# Patient Record
Sex: Female | Born: 1960 | Race: White | Hispanic: No | Marital: Married | Smoking: Never smoker
Health system: Southern US, Community
[De-identification: ages and names within clinical notes are randomized; demographics above are authoritative.]

## PROBLEM LIST (undated history)

## (undated) DIAGNOSIS — G43909 Migraine, unspecified, not intractable, without status migrainosus: Secondary | ICD-10-CM

## (undated) DIAGNOSIS — G4761 Periodic limb movement disorder: Secondary | ICD-10-CM

## (undated) DIAGNOSIS — S52509A Unspecified fracture of the lower end of unspecified radius, initial encounter for closed fracture: Secondary | ICD-10-CM

## (undated) DIAGNOSIS — R0683 Snoring: Secondary | ICD-10-CM

## (undated) DIAGNOSIS — M5030 Other cervical disc degeneration, unspecified cervical region: Secondary | ICD-10-CM

## (undated) HISTORY — PX: WISDOM TOOTH EXTRACTION: SHX21

## (undated) HISTORY — PX: OTHER SURGICAL HISTORY: SHX169

## (undated) HISTORY — DX: Migraine, unspecified, not intractable, without status migrainosus: G43.909

---

## 1998-04-04 ENCOUNTER — Other Ambulatory Visit: Admission: RE | Admit: 1998-04-04 | Discharge: 1998-04-04 | Payer: Self-pay | Admitting: Obstetrics and Gynecology

## 1999-07-03 ENCOUNTER — Other Ambulatory Visit: Admission: RE | Admit: 1999-07-03 | Discharge: 1999-07-03 | Payer: Self-pay | Admitting: Obstetrics and Gynecology

## 2000-02-21 ENCOUNTER — Inpatient Hospital Stay (HOSPITAL_COMMUNITY): Admission: AD | Admit: 2000-02-21 | Discharge: 2000-02-22 | Payer: Self-pay | Admitting: Obstetrics & Gynecology

## 2000-11-15 ENCOUNTER — Encounter: Admission: RE | Admit: 2000-11-15 | Discharge: 2000-11-15 | Payer: Self-pay | Admitting: Family Medicine

## 2000-11-15 ENCOUNTER — Encounter: Payer: Self-pay | Admitting: Family Medicine

## 2001-08-18 ENCOUNTER — Other Ambulatory Visit: Admission: RE | Admit: 2001-08-18 | Discharge: 2001-08-18 | Payer: Self-pay | Admitting: Obstetrics and Gynecology

## 2002-07-31 ENCOUNTER — Other Ambulatory Visit: Admission: RE | Admit: 2002-07-31 | Discharge: 2002-07-31 | Payer: Self-pay | Admitting: Obstetrics and Gynecology

## 2002-11-06 ENCOUNTER — Other Ambulatory Visit: Admission: RE | Admit: 2002-11-06 | Discharge: 2002-11-06 | Payer: Self-pay | Admitting: Obstetrics and Gynecology

## 2003-01-30 ENCOUNTER — Other Ambulatory Visit: Admission: RE | Admit: 2003-01-30 | Discharge: 2003-01-30 | Payer: Self-pay | Admitting: Obstetrics and Gynecology

## 2004-03-03 ENCOUNTER — Other Ambulatory Visit: Admission: RE | Admit: 2004-03-03 | Discharge: 2004-03-03 | Payer: Self-pay | Admitting: Obstetrics and Gynecology

## 2005-11-24 ENCOUNTER — Encounter: Admission: RE | Admit: 2005-11-24 | Discharge: 2005-11-24 | Payer: Self-pay | Admitting: Sports Medicine

## 2006-11-23 ENCOUNTER — Encounter: Admission: RE | Admit: 2006-11-23 | Discharge: 2006-11-23 | Payer: Self-pay | Admitting: Orthopedic Surgery

## 2007-05-14 ENCOUNTER — Emergency Department (HOSPITAL_COMMUNITY): Admission: EM | Admit: 2007-05-14 | Discharge: 2007-05-15 | Payer: Self-pay | Admitting: Emergency Medicine

## 2011-07-27 ENCOUNTER — Other Ambulatory Visit: Payer: Self-pay | Admitting: Internal Medicine

## 2011-07-27 DIAGNOSIS — E010 Iodine-deficiency related diffuse (endemic) goiter: Secondary | ICD-10-CM

## 2011-07-28 ENCOUNTER — Ambulatory Visit
Admission: RE | Admit: 2011-07-28 | Discharge: 2011-07-28 | Disposition: A | Discharge status: Home / Self Care | Payer: BC Managed Care – PPO | Source: Ambulatory Visit | Attending: Internal Medicine | Admitting: Internal Medicine

## 2011-07-28 DIAGNOSIS — E010 Iodine-deficiency related diffuse (endemic) goiter: Secondary | ICD-10-CM

## 2012-02-16 ENCOUNTER — Other Ambulatory Visit: Payer: Self-pay | Admitting: Internal Medicine

## 2012-02-16 DIAGNOSIS — M25551 Pain in right hip: Secondary | ICD-10-CM

## 2012-02-24 ENCOUNTER — Ambulatory Visit
Admission: RE | Admit: 2012-02-24 | Discharge: 2012-02-24 | Disposition: A | Discharge status: Home / Self Care | Payer: BC Managed Care – PPO | Source: Ambulatory Visit | Attending: Internal Medicine | Admitting: Internal Medicine

## 2012-02-24 DIAGNOSIS — M25551 Pain in right hip: Secondary | ICD-10-CM

## 2012-07-20 ENCOUNTER — Other Ambulatory Visit (INDEPENDENT_AMBULATORY_CARE_PROVIDER_SITE_OTHER): Payer: Self-pay | Admitting: Otolaryngology

## 2012-07-20 DIAGNOSIS — J320 Chronic maxillary sinusitis: Secondary | ICD-10-CM

## 2012-08-04 ENCOUNTER — Ambulatory Visit
Admission: RE | Admit: 2012-08-04 | Discharge: 2012-08-04 | Disposition: A | Discharge status: Home / Self Care | Payer: BC Managed Care – PPO | Source: Ambulatory Visit | Attending: Otolaryngology | Admitting: Otolaryngology

## 2012-08-04 DIAGNOSIS — J320 Chronic maxillary sinusitis: Secondary | ICD-10-CM

## 2013-11-08 ENCOUNTER — Other Ambulatory Visit (INDEPENDENT_AMBULATORY_CARE_PROVIDER_SITE_OTHER): Payer: Self-pay | Admitting: General Surgery

## 2013-11-08 DIAGNOSIS — C50419 Malignant neoplasm of upper-outer quadrant of unspecified female breast: Secondary | ICD-10-CM

## 2013-11-08 DIAGNOSIS — C50411 Malignant neoplasm of upper-outer quadrant of right female breast: Secondary | ICD-10-CM

## 2013-11-14 ENCOUNTER — Other Ambulatory Visit (INDEPENDENT_AMBULATORY_CARE_PROVIDER_SITE_OTHER): Payer: Self-pay | Admitting: General Surgery

## 2013-11-24 ENCOUNTER — Encounter: Payer: Self-pay | Admitting: Neurology

## 2013-11-24 ENCOUNTER — Ambulatory Visit (INDEPENDENT_AMBULATORY_CARE_PROVIDER_SITE_OTHER): Payer: BC Managed Care – PPO | Admitting: Neurology

## 2013-11-24 VITALS — BP 129/82 | HR 91 | Ht 65.0 in | Wt 142.0 lb

## 2013-11-24 DIAGNOSIS — G4733 Obstructive sleep apnea (adult) (pediatric): Secondary | ICD-10-CM

## 2013-11-24 NOTE — Progress Notes (Signed)
Subjective:    Patient ID: Lorraine Owen is a 53 y.o. female.  HPI    Star Age, MD, PhD [facility] [address] [place], [place] [address]  Dear Dr. Osborne Casco,   I saw your patient, Lorraine Owen, upon your kind request in my neurologic clinic today for initial consultation of her sleep disturbance, in particular, concern for underlying obstructive sleep apnea. The patient is unaccompanied today. As you know, Ms. Pricilla Owen is a 53 year old right-handed woman with an underlying medical history of B12 deficiency, migraine headaches (has started seeing Dr. Reuel Boom), HSV-2, who reports nocturia x 2-3 times daily, snoring, morning headaches and daytime somnolence. She also endorses some RLS symptoms and has been known to kick in her sleep. She has been told, that she has gasping sounds. She has always been someone to sleep very little about 4 hours up until she turned 43 and consciously tried to increase her total sleep time.   Her typical bedtime is reported to be around 10 PM and usual wake time is around 6 AM. Sleep onset typically occurs within few minutes. She reports feeling adequately rested upon awakening. She wakes up at 2 AM and has trouble staying asleep till 6 AM. She has morning headaches. Her ESS is 1/24. There is no TV in her bedroom. She drinks 3 glasses of tea in the day.    She has been known to snore for the past few years. Snoring is reportedly moderate, and associated with choking sounds and witnessed apneas. The patient admits to a sense of choking or strangling feeling. There is report of nighttime reflux, with occasional nighttime cough experienced. She has seen ENT in the past. She was treated for nasal congestion with a nasal spray.   Her Past Medical History Is Significant For: Past Medical History  Diagnosis Date  . Migraine     Her Past Surgical History Is Significant For: Past Surgical History  Procedure  Laterality Date  . None      Her Family History Is Significant For: Family History  Problem Relation Age of Onset  . Migraines Mother     Her Social History Is Significant For: History   Social History  . Marital Status: Married    Spouse Name: Dellis Filbert    Number of Children: 2  . Years of Education: college   Occupational History  . CPA    Social History Main Topics  . Smoking status: Never Smoker   . Smokeless tobacco: Not on file  . Alcohol Use: No  . Drug Use: No  . Sexual Activity: Not on file   Other Topics Concern  . Not on file   Social History Narrative  . No narrative on file    Her Allergies Are:  Allergies  Allergen Reactions  . Penicillins   . Sulfa Antibiotics   :   Her Current Medications Are:  Outpatient Encounter Prescriptions as of 11/24/2013  Medication Sig  . flurbiprofen (ANSAID) 100 MG tablet   . imipramine (TOFRANIL) 10 MG tablet   . levonorgestrel-ethinyl estradiol (NORDETTE) 0.15-30 MG-MCG tablet   . SUMAtriptan (IMITREX) 50 MG tablet   :  Review of Systems:  Out of a complete 14 point review of systems, all are reviewed and negative with the exception of these symptoms as listed below:  Review of Systems  Allergic/Immunologic:       Some allergies  Neurological:       Headaches Insomnia Snoring Restless legs  Objective:  Neurologic Exam  Physical Exam Physical Examination:   Filed Vitals:   11/24/13 1021  BP: 129/82  Pulse: 91    General Examination: The patient is a very pleasant 53 y.o. female in no acute distress. She appears well-developed and well-nourished and very well groomed.   HEENT: Normocephalic, atraumatic, pupils are equal, round and reactive to light and accommodation. Funduscopic exam is normal with sharp disc margins noted. Extraocular tracking is good without limitation to gaze excursion or nystagmus noted. Normal smooth pursuit is noted. Hearing is grossly intact. Tympanic membranes are clear  bilaterally. Face is symmetric with normal facial animation and normal facial sensation. Speech is clear with no dysarthria noted. There is no hypophonia. There is no lip, neck/head, jaw or voice tremor. Neck is supple with full range of passive and active motion. There are no carotid bruits on auscultation. Oropharynx exam reveals: mild mouth dryness, good dental hygiene and mild airway crowding, due to  narrow airway entry and redundant soft palate. Tongue is mildly elongated but not wide. Mallampati is class II. Tongue protrudes centrally and palate elevates symmetrically. Tonsils are small. Neck size is 13 inches. She has a Mild overbite. Nasal inspection reveals no significant nasal mucosal bogginess  but she has narrow nasal passages. She has mild septal deviation to the right.    Chest: Clear to auscultation without wheezing, rhonchi or crackles noted.  Heart: S1+S2+0, regular and normal without murmurs, rubs or gallops noted.   Abdomen: Soft, non-tender and non-distended with normal bowel sounds appreciated on auscultation.  Extremities: There is no pitting edema in the distal lower extremities bilaterally. Pedal pulses are intact.  Skin: Warm and dry without trophic changes noted. There are no varicose veins.  Musculoskeletal: exam reveals no obvious joint deformities, tenderness or joint swelling or erythema.   Neurologically:  Mental status: The patient is awake, alert and oriented in all 4 spheres. Her immediate and remote memory, attention, language skills and fund of knowledge are appropriate. There is no evidence of aphasia, agnosia, apraxia or anomia. Speech is clear with normal prosody and enunciation. Thought process is linear. Mood is normal and affect is normal.  Cranial nerves II - XII are as described above under HEENT exam. In addition: shoulder shrug is normal with equal shoulder height noted. Motor exam: Normal bulk, strength and tone is noted. There is no drift, tremor or  rebound. Romberg is negative. Reflexes are 2+ throughout. Babinski: Toes are flexor bilaterally. Fine motor skills and coordination: intact with normal finger taps, normal hand movements, normal rapid alternating patting, normal foot taps and normal foot agility.  Cerebellar testing: No dysmetria or intention tremor on finger to nose testing. Heel to shin is unremarkable bilaterally. There is no truncal or gait ataxia.  Sensory exam: intact to light touch, pinprick, vibration, temperature sense in the upper and lower extremities.  Gait, station and balance: She stands easily. No veering to one side is noted. No leaning to one side is noted. Posture is age-appropriate and stance is narrow based. Gait shows normal stride length and normal pace. No problems turning are noted. She turns en bloc. Tandem walk is unremarkable. Intact toe and heel stance is noted.               Assessment and Plan:  In summary, SERAPHINE GUDIEL is a very pleasant 53 y.o.-year old female with an underlying medical history of B12 deficiency, migraine headaches (has started seeing Dr. Reuel Boom), HSV-2, whose history and physical  exam are indeed concerning for obstructive sleep apnea (OSA). She reports nonrestorative sleep, sleep disruption, nocturia, morning headaches, snoring and witnessed apneas per husband. Of note, her sister is in the process of being evaluated for obstructive sleep apnea. She has a total of 6 siblings. Of note, her father was a very heavy snorer. I had a long chat with the patient about my findings and the diagnosis of OSA, its prognosis and treatment options. We talked about medical treatments, surgical interventions and non-pharmacological approaches. I explained in particular the risks and ramifications of untreated moderate to severe OSA, especially with respect to developing cardiovascular disease down the Road, including congestive heart failure, difficult to treat hypertension, cardiac arrhythmias, or  stroke. Even type 2 diabetes has, in part, been linked to untreated OSA. Symptoms of untreated OSA include daytime sleepiness, memory problems, mood irritability and mood disorder such as depression and anxiety, lack of energy, as well as recurrent headaches, especially morning headaches. We talked about trying to maintain a healthy lifestyle in general, as well as the importance of weight control. I encouraged the patient to eat healthy, exercise daily and keep well hydrated, to keep a scheduled bedtime and wake time routine, to not skip any meals and eat healthy snacks in between meals. I advised the patient not to drive when feeling sleepy. I recommended the following at this time: sleep study with potential positive airway pressure titration. (We will score hypopneas at 3% and split the sleep study into diagnostic and treatment portion, if the estimated. 2 hour AHI is >15/h).   I explained the sleep test procedure to the patient and also outlined possible surgical and non-surgical treatment options of OSA, including the use of a custom-made dental device (which would require a referral to a specialist dentist or oral surgeon), upper airway surgical options, such as pillar implants, radiofrequency surgery, tongue base surgery, and UPPP (which would involve a referral to an ENT surgeon). Rarely, jaw surgery such as mandibular advancement may be considered.  I also explained the CPAP treatment option to the patient, who indicated that she would be willing to try CPAP if the need arises. I explained the importance of being compliant with PAP treatment, not only for insurance purposes but primarily to improve Her symptoms, and for the patient's long term health benefit, including to reduce Her cardiovascular risks. I answered all her questions today and the patient was in agreement. I would like to see her back after the sleep study is completed and encouraged her to call with any interim questions, concerns,  problems or updates.   Thank you very much for allowing me to participate in the care of this nice patient. If I can be of any further assistance to you please do not hesitate to call me at [phone].  Sincerely,   Star Age, MD, PhD

## 2014-01-21 ENCOUNTER — Ambulatory Visit (INDEPENDENT_AMBULATORY_CARE_PROVIDER_SITE_OTHER): Payer: BC Managed Care – PPO | Admitting: Neurology

## 2014-01-21 DIAGNOSIS — G4720 Circadian rhythm sleep disorder, unspecified type: Secondary | ICD-10-CM

## 2014-01-21 DIAGNOSIS — G479 Sleep disorder, unspecified: Secondary | ICD-10-CM

## 2014-01-21 DIAGNOSIS — G4733 Obstructive sleep apnea (adult) (pediatric): Secondary | ICD-10-CM

## 2014-01-21 DIAGNOSIS — R0683 Snoring: Secondary | ICD-10-CM

## 2014-01-21 DIAGNOSIS — G4761 Periodic limb movement disorder: Secondary | ICD-10-CM

## 2014-01-21 NOTE — Sleep Study (Signed)
Please see the scanned sleep study interpretation located in the Procedure tab within the Chart Review section. 

## 2014-01-31 ENCOUNTER — Telehealth: Payer: Self-pay | Admitting: Neurology

## 2014-01-31 ENCOUNTER — Encounter: Payer: Self-pay | Admitting: Neurology

## 2014-01-31 NOTE — Telephone Encounter (Signed)
Please call and notify the patient that the recent sleep study did not show any significant obstructive sleep apnea. But: She has significant PLMs, ie. Leg kicking in sleep with arousals, likely causing poor quality sleep and disruption of sleep. Please inform patient that I would like to go over the details of the study during a follow up appointment and if not already previously scheduled, arrange a followup appointment (please utilize a followu-up slot). Also, route or fax report to PCP and referring MD, if other than PCP.  Once you have spoken to patient, you can close this encounter.   Thanks,  Star Age, MD, PhD [facility])

## 2014-02-01 NOTE — Telephone Encounter (Signed)
Patient notified of sleep study results and follow up appointment scheduled. Patient was made aware that her sleep study results were faxed to her PCP, and gave verbal to mail her results.

## 2014-02-20 ENCOUNTER — Ambulatory Visit: Payer: BC Managed Care – PPO | Admitting: Neurology

## 2014-02-20 ENCOUNTER — Telehealth: Payer: Self-pay | Admitting: Neurology

## 2014-02-20 NOTE — Telephone Encounter (Signed)
Patient is a no show for today's appointment, daughter is sick (02/20/14)

## 2014-02-20 NOTE — Telephone Encounter (Signed)
Child is sick, per patient ,cancelled appointment(02/20/14)

## 2014-04-15 ENCOUNTER — Emergency Department (HOSPITAL_COMMUNITY): Payer: BLUE CROSS/BLUE SHIELD

## 2014-04-15 ENCOUNTER — Encounter (HOSPITAL_COMMUNITY): Payer: Self-pay | Admitting: Emergency Medicine

## 2014-04-15 ENCOUNTER — Emergency Department (HOSPITAL_COMMUNITY)
Admission: EM | Admit: 2014-04-15 | Discharge: 2014-04-15 | Disposition: A | Discharge status: Home / Self Care | Payer: BLUE CROSS/BLUE SHIELD | Attending: Emergency Medicine | Admitting: Emergency Medicine

## 2014-04-15 DIAGNOSIS — G43909 Migraine, unspecified, not intractable, without status migrainosus: Secondary | ICD-10-CM | POA: Insufficient documentation

## 2014-04-15 DIAGNOSIS — H538 Other visual disturbances: Secondary | ICD-10-CM | POA: Diagnosis present

## 2014-04-15 DIAGNOSIS — Z880 Allergy status to penicillin: Secondary | ICD-10-CM | POA: Diagnosis not present

## 2014-04-15 DIAGNOSIS — H539 Unspecified visual disturbance: Secondary | ICD-10-CM

## 2014-04-15 DIAGNOSIS — R202 Paresthesia of skin: Secondary | ICD-10-CM | POA: Diagnosis not present

## 2014-04-15 DIAGNOSIS — G43109 Migraine with aura, not intractable, without status migrainosus: Secondary | ICD-10-CM

## 2014-04-15 HISTORY — DX: Periodic limb movement disorder: G47.61

## 2014-04-15 HISTORY — DX: Snoring: R06.83

## 2014-04-15 HISTORY — DX: Other cervical disc degeneration, unspecified cervical region: M50.30

## 2014-04-15 LAB — COMPREHENSIVE METABOLIC PANEL
ALBUMIN: 3.8 g/dL (ref 3.5–5.2)
ALT: 13 U/L (ref 0–35)
ANION GAP: 4 — AB (ref 5–15)
AST: 18 U/L (ref 0–37)
Alkaline Phosphatase: 47 U/L (ref 39–117)
BUN: 12 mg/dL (ref 6–23)
CO2: 27 mmol/L (ref 19–32)
Calcium: 8.8 mg/dL (ref 8.4–10.5)
Chloride: 108 mmol/L (ref 96–112)
Creatinine, Ser: 1.03 mg/dL (ref 0.50–1.10)
GFR calc Af Amer: 71 mL/min — ABNORMAL LOW (ref >90)
GFR, EST NON AFRICAN AMERICAN: 61 mL/min — AB (ref >90)
GLUCOSE: 109 mg/dL — AB (ref 70–99)
Potassium: 3.5 mmol/L (ref 3.5–5.1)
Sodium: 139 mmol/L (ref 135–145)
Total Bilirubin: 0.1 mg/dL — ABNORMAL LOW (ref 0.3–1.2)
Total Protein: 6.3 g/dL (ref 6.0–8.3)

## 2014-04-15 LAB — DIFFERENTIAL
BASOS ABS: 0 10*3/uL (ref 0.0–0.1)
Basophils Relative: 0 % (ref 0–1)
EOS PCT: 1 % (ref 0–5)
Eosinophils Absolute: 0.1 10*3/uL (ref 0.0–0.7)
Lymphocytes Relative: 33 % (ref 12–46)
Lymphs Abs: 2.1 10*3/uL (ref 0.7–4.0)
MONO ABS: 0.3 10*3/uL (ref 0.1–1.0)
Monocytes Relative: 5 % (ref 3–12)
Neutro Abs: 3.9 10*3/uL (ref 1.7–7.7)
Neutrophils Relative %: 61 % (ref 43–77)

## 2014-04-15 LAB — I-STAT CHEM 8, ED
BUN: 13 mg/dL (ref 6–23)
Calcium, Ion: 1.19 mmol/L (ref 1.12–1.23)
Chloride: 104 mmol/L (ref 96–112)
Creatinine, Ser: 1 mg/dL (ref 0.50–1.10)
Glucose, Bld: 103 mg/dL — ABNORMAL HIGH (ref 70–99)
HCT: 39 % (ref 36.0–46.0)
Hemoglobin: 13.3 g/dL (ref 12.0–15.0)
POTASSIUM: 3.5 mmol/L (ref 3.5–5.1)
SODIUM: 141 mmol/L (ref 135–145)
TCO2: 22 mmol/L (ref 0–100)

## 2014-04-15 LAB — RAPID URINE DRUG SCREEN, HOSP PERFORMED
AMPHETAMINES: NOT DETECTED
Barbiturates: NOT DETECTED
Benzodiazepines: NOT DETECTED
Cocaine: NOT DETECTED
OPIATES: NOT DETECTED
TETRAHYDROCANNABINOL: NOT DETECTED

## 2014-04-15 LAB — URINALYSIS, ROUTINE W REFLEX MICROSCOPIC
BILIRUBIN URINE: NEGATIVE
Glucose, UA: NEGATIVE mg/dL
HGB URINE DIPSTICK: NEGATIVE
Ketones, ur: NEGATIVE mg/dL
Leukocytes, UA: NEGATIVE
Nitrite: NEGATIVE
Protein, ur: NEGATIVE mg/dL
SPECIFIC GRAVITY, URINE: 1.007 (ref 1.005–1.030)
Urobilinogen, UA: 0.2 mg/dL (ref 0.0–1.0)
pH: 6 (ref 5.0–8.0)

## 2014-04-15 LAB — CBC
HCT: 37.1 % (ref 36.0–46.0)
Hemoglobin: 12.7 g/dL (ref 12.0–15.0)
MCH: 30.7 pg (ref 26.0–34.0)
MCHC: 34.2 g/dL (ref 30.0–36.0)
MCV: 89.6 fL (ref 78.0–100.0)
PLATELETS: 319 10*3/uL (ref 150–400)
RBC: 4.14 MIL/uL (ref 3.87–5.11)
RDW: 12.5 % (ref 11.5–15.5)
WBC: 6.4 10*3/uL (ref 4.0–10.5)

## 2014-04-15 LAB — PROTIME-INR
INR: 0.97 (ref 0.00–1.49)
Prothrombin Time: 13 seconds (ref 11.6–15.2)

## 2014-04-15 LAB — APTT: aPTT: 28 seconds (ref 24–37)

## 2014-04-15 LAB — ETHANOL: Alcohol, Ethyl (B): <5 mg/dL (ref 0–9)

## 2014-04-15 LAB — I-STAT TROPONIN, ED: Troponin i, poc: 0 ng/mL (ref 0.00–0.08)

## 2014-04-15 MED ORDER — ONDANSETRON HCL 4 MG PO TABS: 4.0000 mg | ORAL_TABLET | Freq: Four times a day (QID) | ORAL | Status: DC | PRN

## 2014-04-15 NOTE — ED Notes (Signed)
Pt c/o vision changes to both eyes at 1320 followed by numbness to right hand. Pt reports vision cleared up but numbness remains. Speech clear, no facial droop, equal grips.

## 2014-04-15 NOTE — ED Provider Notes (Signed)
6:25 PM Ct Head (brain) Wo Contrast  04/15/2014   CLINICAL DATA:  Could stroke, blurry vision  EXAM: CT HEAD WITHOUT CONTRAST  TECHNIQUE: Contiguous axial images were obtained from the base of the skull through the vertex without intravenous contrast.  COMPARISON:  None.  FINDINGS: No acute intracranial hemorrhage. No focal mass lesion. No CT evidence of acute infarction. No midline shift or mass effect. No hydrocephalus. Basilar cisterns are patent.  Paranasal sinuses and  mastoid air cells are clear.  IMPRESSION: Normal head CT.  Findings conveyed to [facility] 04/15/2014  at14:55.   Electronically Signed   By: Suzy Bouchard M.D.   On: 04/15/2014 14:56   Mr Brain Wo Contrast  04/15/2014   CLINICAL DATA:  Sudden onset of visual change that began earlier today. This was followed by RIGHT upper extremity numbness and tingling. History of migraine headaches. Initial encounter.  EXAM: MRI HEAD WITHOUT CONTRAST  TECHNIQUE: Multiplanar, multiecho pulse sequences of the brain and surrounding structures were obtained without intravenous contrast.  COMPARISON:  CT head earlier today.  FINDINGS: Mild motion degradation.  Overall study diagnostic.  No evidence for acute infarction, hemorrhage, mass lesion, hydrocephalus, or extra-axial fluid. Normal cerebral volume. No significant white matter disease. Flow voids are maintained throughout the carotid, basilar, and vertebral arteries. There are no areas of chronic hemorrhage. Pituitary, pineal, and cerebellar tonsils unremarkable. No upper cervical lesions. Visualized calvarium, skull base, and upper cervical osseous structures unremarkable. Scalp and extracranial soft tissues, orbits, sinuses, and mastoids show no acute process. 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  IMPRESSION: Negative exam.   Electronically Signed   By: Rolla Flatten M.D.   On: 04/15/2014 17:55  I personally reviewed the imaging tests through PACS  system I reviewed available ER/hospitalization records through the EMR   6:25 PM Patient feels much better this time.  She is asymptomatic.  She did have some nausea earlier and therefore she'll be prescribed Zofran for home.  She denies pain at this time.  She denies weakness of her arms or legs.  No visual disturbances at this time.  Please see neurology consultation note for complete details.  MRI negative.  Possible migraine variant.  Hoy Morn, MD 04/15/14 [phone]

## 2014-04-15 NOTE — Consult Note (Signed)
Referring Physician: Dr Thurnell Garbe    Chief Complaint: Visual disturbance with right upper extremity numbness  HPI: Lorraine Owen is a 54 y.o. female patient of T[facility]'s who also recently started seeing Dr. Melton Alar for a history of migraine headaches. The patient has otherwise been healthy. She has never had any associated symptoms with her migraine headaches in the past. They are usually relieved with Imitrex. Occasionally she takes prescription strength ibuprofen as well as imipramine at bedtime. Today she was [place] the [place] texting her daughter on her phone when she developed visual disturbances consisting of a very bright light in both visual fields. She had difficulty seeing her phone but had no problems with spelling or word finding. She lives close by and was by herself so she decided to drive home which she was able to do without difficulty. Upon arriving home she had another daughter look up stroke symptoms on the computer. Shortly after arriving home she developed numbness and tingling, but no weakness, of the right upper extremity. She estimated the time of her initial visual deficits at approximately 1:20 PM. The numbness and tingling started about 10 minutes later. She did not have a headache associated with these symptoms She came to the [facility] emergency department as a code stroke. An initial head CT was negative. Her NIH scale was 0. The visual disturbance had resolved but she continued to have tingling of her right hand.   Her husband accompanied her to the emergency department. 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 She is scheduled to fly to [facility] at 7:30 AM tomorrow on business. She does not wish to be admitted to the hospital. Dr. Doy Mince has ordered an MRI of the brain. If this is negative the patient will be discharged from the emergency department with further workup as needed on an  outpatient basis.    Date last known well: Date: 04/15/2014 Time last known well: Time: 13:15 tPA Given: No: Minimal deficits, symptoms resolving.  Past Medical History  Diagnosis Date  . Migraine   . DDD (degenerative disc disease), cervical   . Periodic limb movement disorder (PLMD)   . Snoring     Past Surgical History  Procedure Laterality Date  . None      Family History  Problem Relation Age of Onset  . Migraines Mother   Her father died in his 32s from natural causes. Her mother is almost 53 and is alive and well with a history of migraine headaches.   Social History:  reports that she has never smoked. She does not have any smokeless tobacco history on file. She reports that she does not drink alcohol or use illicit drugs. she works as a Engineer, maintenance (IT). She lives in [place] with her husband and between them they have 4 children.  Allergies:  Allergies  Allergen Reactions  . Penicillins   . Sulfa Antibiotics     Medications: No current facility-administered medications for this encounter.   Current Outpatient Prescriptions  Medication Sig Dispense Refill  . flurbiprofen (ANSAID) 100 MG tablet Take 100 mg by mouth daily as needed (headache).     . fluticasone (FLONASE) 50 MCG/ACT nasal spray Place 1 spray into both nostrils daily as needed for allergies or rhinitis.    Marland Kitchen imipramine (TOFRANIL) 10 MG tablet Take 20 mg by mouth at bedtime.     Marland Kitchen levonorgestrel-ethinyl estradiol (NORDETTE)  0.15-30 MG-MCG tablet Take 1 tablet by mouth daily.     . Probiotic Product (PROBIOTIC PO) Take 1 tablet by mouth daily.    . SUMAtriptan (IMITREX) 50 MG tablet Take 50 mg by mouth daily as needed for migraine or headache.     . vitamin B-12 (CYANOCOBALAMIN) 1000 MCG tablet Take 1,000 mcg by mouth daily.       ROS: History obtained from the patient and husband  General ROS: negative for - chills, fatigue, fever, night sweats, weight gain or weight loss. Positive for severe stress  recently related to work. Psychological ROS: negative for - behavioral disorder, hallucinations, memory difficulties, mood swings or suicidal ideation Ophthalmic ROS: negative for - blurry vision, double vision, eye pain or loss of vision ENT ROS: negative for - epistaxis, nasal discharge, oral lesions, sore throat, tinnitus or vertigo Allergy and Immunology ROS: negative for - hives or itchy/watery eyes Hematological and Lymphatic ROS: negative for - bleeding problems, bruising or swollen lymph nodes Endocrine ROS: negative for - galactorrhea, hair pattern changes, polydipsia/polyuria or temperature intolerance Respiratory ROS: negative for - cough, hemoptysis, shortness of breath or wheezing Cardiovascular ROS: negative for - chest pain, dyspnea on exertion, edema or irregular heartbeat Gastrointestinal ROS: negative for - abdominal pain, diarrhea, hematemesis, nausea/vomiting or stool incontinence Genito-Urinary ROS: negative for - dysuria, hematuria, incontinence or urinary frequency/urgency Musculoskeletal ROS: negative for - joint swelling or muscular weakness. Positive for bilateral shoulder pain recently which she attributes to long hours at the computer. Neurological ROS: as noted in HPI Dermatological ROS: negative for rash and skin lesion changes   Physical Examination: Blood pressure 116/78, pulse 74, temperature 98.2 F (36.8 C), temperature source Oral, resp. rate 15, height 5' 5.5" (1.664 m), weight 61.236 kg (135 lb), SpO2 94 %.   HEENT-  Normocephalic, no lesions, without obvious abnormality.  Normal external eye and conjunctiva.  Normal TM's bilaterally.  Normal auditory canals and external ears. Normal external nose, mucus membranes and septum.  Normal pharynx. Cardiovascular- S1, S2 normal, pulses palpable throughout   Lungs- chest clear, no wheezing, rales, normal symmetric air entry Abdomen- soft, non-tender; bowel sounds normal; no masses,  no organomegaly Extremities-  no edema Lymph-no adenopathy palpable Musculoskeletal-no joint tenderness, deformity or swelling Skin-warm and dry, no hyperpigmentation, vitiligo, or suspicious lesions  Neurological Examination Mental Status: Alert, oriented, thought content appropriate.  Speech fluent without evidence of aphasia.  Able to follow 3 step commands without difficulty. Cranial Nerves: II: Discs not visualized; Visual fields grossly normal, pupils equal, round, reactive to light and accommodation III,IV, VI: ptosis not present, extra-ocular motions intact bilaterally V,VII: smile symmetric, facial light touch sensation normal bilaterally VIII: hearing normal bilaterally IX,X: gag reflex present XI: bilateral shoulder shrug XII: midline tongue extension Motor: Right : Upper extremity   5/5    Left:     Upper extremity   5/5  Lower extremity   5/5     Lower extremity   5/5 Tone and bulk:normal tone throughout; no atrophy noted Sensory: Light touch intact throughout, bilaterally Deep Tendon Reflexes: 2+ and symmetric throughout Plantars: Right: downgoing   Left: downgoing Cerebellar: normal finger-to-nose, normal rapid alternating movements and normal heel-to-shin test    Laboratory Studies:  Basic Metabolic Panel:  Recent Labs Lab 04/15/14 1444 04/15/14 1501  NA 139 141  K 3.5 3.5  CL 108 104  CO2 27  --   GLUCOSE 109* 103*  BUN 12 13  CREATININE 1.03 1.00  CALCIUM 8.8  --  Liver Function Tests:  Recent Labs Lab 04/15/14 1444  AST 18  ALT 13  ALKPHOS 47  BILITOT 0.1*  PROT 6.3  ALBUMIN 3.8   No results for input(s): LIPASE, AMYLASE in the last 168 hours. No results for input(s): AMMONIA in the last 168 hours.  CBC:  Recent Labs Lab 04/15/14 1444 04/15/14 1501  WBC 6.4  --   NEUTROABS 3.9  --   HGB 12.7 13.3  HCT 37.1 39.0  MCV 89.6  --   PLT 319  --     Cardiac Enzymes: No results for input(s): CKTOTAL, CKMB, CKMBINDEX, TROPONINI in the last 168  hours.  BNP: Invalid input(s): POCBNP  CBG: No results for input(s): GLUCAP in the last 168 hours.  Microbiology: No results found for this or any previous visit.  Coagulation Studies:  Recent Labs  04/15/14 1444  LABPROT 13.0  INR 0.97    Urinalysis:   Recent Labs Lab 04/15/14 1446  COLORURINE YELLOW  LABSPEC 1.007  PHURINE 6.0  GLUCOSEU NEGATIVE  HGBUR NEGATIVE  BILIRUBINUR NEGATIVE  KETONESUR NEGATIVE  PROTEINUR NEGATIVE  UROBILINOGEN 0.2  NITRITE NEGATIVE  LEUKOCYTESUR NEGATIVE    Lipid Panel: No results found for: CHOL, TRIG, HDL, CHOLHDL, VLDL, LDLCALC  HgbA1C: No results found for: HGBA1C  Urine Drug Screen:      Component Value Date/Time   LABOPIA NONE DETECTED 04/15/2014 1446   COCAINSCRNUR NONE DETECTED 04/15/2014 1446   LABBENZ NONE DETECTED 04/15/2014 1446   AMPHETMU NONE DETECTED 04/15/2014 1446   THCU NONE DETECTED 04/15/2014 1446   LABBARB NONE DETECTED 04/15/2014 1446    Alcohol Level:   Recent Labs Lab 04/15/14 [place] <5    Other results: EKG: Sinus rhythm rate 80 bpm. Please refer to the formal reading from the cardiologist for complete details.  Imaging:  Ct Head (brain) Wo Contrast 04/15/2014    Normal head CT.   Mikey Bussing PA-C [facility] Pager [phone] 04/15/2014, 4:03 PM   Patient seen and examined.  Clinical course and management discussed.  Necessary edits performed.  I agree with the above.  Assessment and plan of care developed and discussed below.  Assessment: 54 y.o. female with a history of uncomplicated migraine who today, while sitting in her car, developed sudden onset of a visual disturbance consisting of bright white lights obscuring both visual fields and subsequent onset of right upper extremity numbness with tingling. There was no associated headache. Neurological examination at this point is only significant for an unusual sensation of her right arm, but no abnormalities on  formal testing.  Head CT personally reviewed and shows no acute changes.  Patient on no antiplatelet therapy.  Symptoms likely represent a migraine equivalent.  Further work up recommended.     Stroke Risk Factors - Migraine headaches  Plan:  MRI of the brain without contrast  Prophylactic therapy-Antiplatelet med: Aspirin - dose 81 mg daily  If MRI of the brain unremarkable, no further neurological intervention recommended.  Patient to follow up with PCP.       Alexis Goodell, MD [facility] [phone]  04/15/2014  6:02 PM  Addendum: MRI of the brain personally reviewed and is normal.  No further work up recommended at this time.  Case discussed with Dr. Kerrin Champagne, MD [facility] [phone]

## 2014-04-15 NOTE — Progress Notes (Signed)
Code stroke called at 1442.  Patient arrived to [facility] ED via private vehicle.  As per patient LSN 3846, she developed right hand numbness and flashes of light in both eyes.  Flashing of lights resolved on arrival to ED.  Patient states she still has some right hand numbness.  She states she has a history of migraines, however she does not have a headache and these symptoms are not typical.  NIHSS 0.

## 2014-04-15 NOTE — ED Provider Notes (Signed)
CSN: [id number]     Arrival date & time 04/15/14  1414 History   First MD Initiated Contact with Patient 04/15/14 1441     Chief Complaint  Patient presents with  . Blurred Vision  . Numbness      HPI Pt was seen at 1445. Per pt, c/o sudden onset and resolution of one episode of "visual change" that began at 1320 PTA. Pt describes her visual change as "a bright light in both my eyes like a camera flash." This was followed by RUE "numbness" and "tingling." States her visual symptoms have improved, but her RUE "numbness" continues. Denies headache, no slurred speech, no facial droop, no focal motor weakness, no ataxia, no CP/palpitations, no SOB/cough, no abd pain, no N/V/D.    Past Medical History  Diagnosis Date  . Migraine    Past Surgical History  Procedure Laterality Date  . None     Family History  Problem Relation Age of Onset  . Migraines Mother    History  Substance Use Topics  . Smoking status: Never Smoker   . Smokeless tobacco: Not on file  . Alcohol Use: No    Review of Systems ROS: Statement: All systems negative except as marked or noted in the HPI; Constitutional: Negative for fever and chills. ; ; Eyes: +"bright light" both eyes. Negative for eye pain, redness and discharge. ; ; ENMT: Negative for ear pain, hoarseness, nasal congestion, sinus pressure and sore throat. ; ; Cardiovascular: Negative for chest pain, palpitations, diaphoresis, dyspnea and peripheral edema. ; ; Respiratory: Negative for cough, wheezing and stridor. ; ; Gastrointestinal: Negative for nausea, vomiting, diarrhea, abdominal pain, blood in stool, hematemesis, jaundice and rectal bleeding. . ; ; Genitourinary: Negative for dysuria, flank pain and hematuria. ; ; Musculoskeletal: Negative for back pain and neck pain. Negative for swelling and trauma.; ; Skin: Negative for pruritus, rash, abrasions, blisters, bruising and skin lesion.; ; Neuro: +RUE "numbness" and "tingling." Negative for headache,  lightheadedness and neck stiffness. Negative for weakness, altered level of consciousness , altered mental status, extremity weakness, involuntary movement, seizure and syncope.      Allergies  Penicillins and Sulfa antibiotics  Home Medications   Prior to Admission medications   Medication Sig Start Date End Date Taking? Authorizing Provider  flurbiprofen (ANSAID) 100 MG tablet  09/28/13   Historical Provider, MD  imipramine (TOFRANIL) 10 MG tablet  09/28/13   Historical Provider, MD  levonorgestrel-ethinyl estradiol (NORDETTE) 0.15-30 MG-MCG tablet  11/01/13   Historical Provider, MD  SUMAtriptan (IMITREX) 50 MG tablet  09/27/13   Historical Provider, MD   BP 135/76 mmHg  Pulse 98  Temp(Src) 98.2 F (36.8 C) (Oral)  Resp 16  Ht 5' 5.5" (1.664 m)  Wt 135 lb (61.236 kg)  BMI 22.12 kg/m2  SpO2 98% Physical Exam  1450; Physical examination:  Nursing notes reviewed; Vital signs and O2 SAT reviewed;  Constitutional: Well developed, Well nourished, Well hydrated, In no acute distress; Head:  Normocephalic, atraumatic; Eyes: EOMI, PERRL, No scleral icterus; ENMT: Mouth and pharynx normal, Mucous membranes moist; Neck: Supple, Full range of motion, No lymphadenopathy; Cardiovascular: Regular rate and rhythm, No murmur, rub, or gallop; Respiratory: Breath sounds clear & equal bilaterally, No rales, rhonchi, wheezes.  Speaking full sentences with ease, Normal respiratory effort/excursion; Chest: Nontender, Movement normal; Abdomen: Soft, Nontender, Nondistended, Normal bowel sounds; Genitourinary: No CVA tenderness; Extremities: Pulses normal, No tenderness, No edema, No calf edema or asymmetry.; Neuro: AA&Ox3, Major CN grossly intact. Speech  clear.  No facial droop.  No nystagmus. Grips equal. Strength 5/5 equal bilat UE's and LE's.  DTR 2/4 equal bilat UE's and LE's.  +subjective decreased sensation RUE, otherwise gross sensory deficits.  Normal cerebellar testing bilat UE's (finger-nose) and LE's  (heel-shin)..; Skin: Color normal, Warm, Dry.   ED Course  Procedures     EKG Interpretation   Date/Time:  Sunday April 15 2014 15:22:42 EST Ventricular Rate:  80 PR Interval:  181 QRS Duration: 86 QT Interval:  382 QTC Calculation: 441 R Axis:   86 Text Interpretation:  Sinus rhythm Artifact No old tracing to compare  Confirmed by [facility]  MD, Nunzio Cory [phone]) on 04/15/2014 3:47:28 PM      MDM  MDM Reviewed: previous chart, nursing note and vitals Reviewed previous: labs Interpretation: labs and CT scan      Results for orders placed or performed during the hospital encounter of 04/15/14  Ethanol  Result Value Ref Range   Alcohol, Ethyl (B) <5 0 - 9 mg/dL  Protime-INR  Result Value Ref Range   Prothrombin Time 13.0 11.6 - 15.2 seconds   INR 0.97 0.00 - 1.49  APTT  Result Value Ref Range   aPTT 28 24 - 37 seconds  CBC  Result Value Ref Range   WBC 6.4 4.0 - 10.5 K/uL   RBC 4.14 3.87 - 5.11 MIL/uL   Hemoglobin 12.7 12.0 - 15.0 g/dL   HCT 37.1 36.0 - 46.0 %   MCV 89.6 78.0 - 100.0 fL   MCH 30.7 26.0 - 34.0 pg   MCHC 34.2 30.0 - 36.0 g/dL   RDW 12.5 11.5 - 15.5 %   Platelets 319 150 - 400 K/uL  Differential  Result Value Ref Range   Neutrophils Relative % 61 43 - 77 %   Neutro Abs 3.9 1.7 - 7.7 K/uL   Lymphocytes Relative 33 12 - 46 %   Lymphs Abs 2.1 0.7 - 4.0 K/uL   Monocytes Relative 5 3 - 12 %   Monocytes Absolute 0.3 0.1 - 1.0 K/uL   Eosinophils Relative 1 0 - 5 %   Eosinophils Absolute 0.1 0.0 - 0.7 K/uL   Basophils Relative 0 0 - 1 %   Basophils Absolute 0.0 0.0 - 0.1 K/uL  Comprehensive metabolic panel  Result Value Ref Range   Sodium 139 135 - 145 mmol/L   Potassium 3.5 3.5 - 5.1 mmol/L   Chloride 108 96 - 112 mmol/L   CO2 27 19 - 32 mmol/L   Glucose, Bld 109 (H) 70 - 99 mg/dL   BUN 12 6 - 23 mg/dL   Creatinine, Ser 1.03 0.50 - 1.10 mg/dL   Calcium 8.8 8.4 - 10.5 mg/dL   Total Protein 6.3 6.0 - 8.3 g/dL   Albumin 3.8 3.5 - 5.2 g/dL    AST 18 0 - 37 U/L   ALT 13 0 - 35 U/L   Alkaline Phosphatase 47 39 - 117 U/L   Total Bilirubin 0.1 (L) 0.3 - 1.2 mg/dL   GFR calc non Af Amer 61 (L) >90 mL/min   GFR calc Af Amer 71 (L) >90 mL/min   Anion gap 4 (L) 5 - 15  I-Stat Chem 8, ED  Result Value Ref Range   Sodium 141 135 - 145 mmol/L   Potassium 3.5 3.5 - 5.1 mmol/L   Chloride 104 96 - 112 mmol/L   BUN 13 6 - 23 mg/dL   Creatinine, Ser 1.00 0.50 - 1.10 mg/dL  Glucose, Bld 103 (H) 70 - 99 mg/dL   Calcium, Ion 1.19 1.12 - 1.23 mmol/L   TCO2 22 0 - 100 mmol/L   Hemoglobin 13.3 12.0 - 15.0 g/dL   HCT 39.0 36.0 - 46.0 %  I-Stat Troponin, ED (not at [facility])  Result Value Ref Range   Troponin i, poc 0.00 0.00 - 0.08 ng/mL   Comment 3           Ct Head (brain) Wo Contrast 04/15/2014   CLINICAL DATA:  Could stroke, blurry vision  EXAM: CT HEAD WITHOUT CONTRAST  TECHNIQUE: Contiguous axial images were obtained from the base of the skull through the vertex without intravenous contrast.  COMPARISON:  None.  FINDINGS: No acute intracranial hemorrhage. No focal mass lesion. No CT evidence of acute infarction. No midline shift or mass effect. No hydrocephalus. Basilar cisterns are patent.  Paranasal sinuses and  mastoid air cells are clear.  IMPRESSION: Normal head CT.  Findings conveyed to [facility] 04/15/2014  at14:55.   Electronically Signed   By: Suzy Bouchard M.D.   On: 04/15/2014 14:56    1550:  Code Stroke called by Triage RN on pt's arrival to the ED. Code Stroke team has evaluated pt in the ED: pt does not want to be admitted, request to order MRI brain and, if negative, pt can be discharged home. Pt and family are agreeable with this plan.   1630:  MRI brain pending. Sign out to Dr. Venora Maples.   Francine Graven, DO 04/15/14 [phone]

## 2015-02-17 HISTORY — PX: TOE SURGERY: SHX1073

## 2015-05-20 DIAGNOSIS — Z6824 Body mass index (BMI) 24.0-24.9, adult: Secondary | ICD-10-CM | POA: Diagnosis not present

## 2015-05-20 DIAGNOSIS — Z124 Encounter for screening for malignant neoplasm of cervix: Secondary | ICD-10-CM | POA: Diagnosis not present

## 2015-05-20 DIAGNOSIS — Z01419 Encounter for gynecological examination (general) (routine) without abnormal findings: Secondary | ICD-10-CM | POA: Diagnosis not present

## 2015-06-13 DIAGNOSIS — Z1231 Encounter for screening mammogram for malignant neoplasm of breast: Secondary | ICD-10-CM | POA: Diagnosis not present

## 2015-09-02 DIAGNOSIS — D485 Neoplasm of uncertain behavior of skin: Secondary | ICD-10-CM | POA: Diagnosis not present

## 2015-09-02 DIAGNOSIS — B079 Viral wart, unspecified: Secondary | ICD-10-CM | POA: Diagnosis not present

## 2015-12-11 DIAGNOSIS — S52692A Other fracture of lower end of left ulna, initial encounter for closed fracture: Secondary | ICD-10-CM | POA: Diagnosis not present

## 2015-12-11 DIAGNOSIS — M25532 Pain in left wrist: Secondary | ICD-10-CM | POA: Diagnosis not present

## 2015-12-11 DIAGNOSIS — S52542A Smith's fracture of left radius, initial encounter for closed fracture: Secondary | ICD-10-CM | POA: Diagnosis not present

## 2015-12-11 DIAGNOSIS — W010XXA Fall on same level from slipping, tripping and stumbling without subsequent striking against object, initial encounter: Secondary | ICD-10-CM | POA: Diagnosis not present

## 2015-12-11 DIAGNOSIS — S52602A Unspecified fracture of lower end of left ulna, initial encounter for closed fracture: Secondary | ICD-10-CM | POA: Diagnosis not present

## 2015-12-11 DIAGNOSIS — M19032 Primary osteoarthritis, left wrist: Secondary | ICD-10-CM | POA: Diagnosis not present

## 2015-12-11 DIAGNOSIS — S52352A Displaced comminuted fracture of shaft of radius, left arm, initial encounter for closed fracture: Secondary | ICD-10-CM | POA: Diagnosis not present

## 2015-12-11 DIAGNOSIS — R200 Anesthesia of skin: Secondary | ICD-10-CM | POA: Diagnosis not present

## 2015-12-11 DIAGNOSIS — S52502A Unspecified fracture of the lower end of left radius, initial encounter for closed fracture: Secondary | ICD-10-CM | POA: Diagnosis not present

## 2015-12-11 DIAGNOSIS — S80212A Abrasion, left knee, initial encounter: Secondary | ICD-10-CM | POA: Diagnosis not present

## 2015-12-13 ENCOUNTER — Other Ambulatory Visit: Payer: Self-pay | Admitting: Orthopedic Surgery

## 2015-12-13 DIAGNOSIS — S52692A Other fracture of lower end of left ulna, initial encounter for closed fracture: Secondary | ICD-10-CM | POA: Diagnosis not present

## 2015-12-13 DIAGNOSIS — S52572A Other intraarticular fracture of lower end of left radius, initial encounter for closed fracture: Secondary | ICD-10-CM | POA: Diagnosis not present

## 2015-12-16 ENCOUNTER — Encounter (HOSPITAL_BASED_OUTPATIENT_CLINIC_OR_DEPARTMENT_OTHER): Payer: Self-pay | Admitting: *Deleted

## 2015-12-16 NOTE — H&P (Signed)
Lorraine Owen is an 55 y.o. female.   CC / Reason for Visit: Left wrist injury HPI: This Lorraine Owen is a 55 year old RHD female who presents for evaluation of a left wrist injury.  She reports that she was running and fell onto an outstretched hand while out of town in [place].  He was evaluated in the emergency department there locally, where x-rays were obtained and she was placed into a sugar tong splint.  She presents for further evaluation.  X-rays are available on CD revealing a comminuted intra-articular displaced distal radius fracture, with a much more mildly displaced distal ulna fracture.  She has some oxycodone, and hasn't taken much but did take 1 tablet in anticipation of this visit.  Past Medical History:  Diagnosis Date  . DDD (degenerative disc disease), cervical   . Distal radial fracture    left  . Migraine   . Periodic limb movement disorder (PLMD)   . Snoring     Past Surgical History:  Procedure Laterality Date  . none    . WISDOM TOOTH EXTRACTION      Family History  Problem Relation Age of Onset  . Migraines Mother    Social History:  reports that she has never smoked. She has never used smokeless tobacco. She reports that she does not drink alcohol or use drugs.  Allergies:  Allergies  Allergen Reactions  . Sulfa Antibiotics   . Penicillins Rash    No prescriptions prior to admission.    No results found for this or any previous visit (from the past 48 hour(s)). No results found.  Review of Systems  All other systems reviewed and are negative.   Height 5' 5.5" (1.664 m), weight 61.2 kg (135 lb), last menstrual period 11/16/2014. Physical Exam  Constitutional:  WD, WN, NAD HEENT:  NCAT, EOMI Neuro/Psych:  Alert & oriented to person, place, and time; appropriate mood & affect Lymphatic: No generalized UE edema or lymphadenopathy Extremities / MSK:  Both UE are normal with respect to appearance, ranges of motion, joint stability, muscle  strength/tone, sensation, & perfusion except as otherwise noted:  Sugar tong splint is taken down.  No skin lesions such as ulcerations, etc.  The hand and wrist are puffy.  Digital motion is moderately diminished, with intact light touch sensibility in the radial, ulnar, and median nerve distributions and intact motor to the same.  No tenderness about the elbow  Labs / Xrays:  No radiographic studies obtained today.  Assessment: Comminuted displaced intra-articular left distal radius with a extra-articular minimally displaced distal ulna fracture  Plan:  I discussed these findings with her and her husband.  I used the x-rays, plastic models, and examples of plates to demonstrate the nature of the problem and the competing options.  After careful consideration and deliberation, we decided to proceed with open treatment, hopefully with just volar plate fixation of the distal radius.  I will need to address the stability and alignment of the ulna fracture after completion of fixation of the radius.  She will be placed back into a sugar tong splint for now with planned skeletal reconstruction next Tuesday.  In the course of the visit, she began to have more itching in the left upper extremity and we addressed ways to best manage that.  She'll plans to minimize her narcotic use in the interim.  The details of the operative procedure were discussed with the Lorraine Owen.  Questions were invited and answered.  In addition to the goal  of the procedure, the risks of the procedure to include but not limited to bleeding; infection; damage to the nerves or blood vessels that could result in bleeding, numbness, weakness, chronic pain, and the need for additional procedures; stiffness; the need for revision surgery; and anesthetic risks were reviewed.  No specific outcome was guaranteed or implied.  Informed consent was obtained.  Kaynen Minner A., MD 12/16/2015, 5:36 PM

## 2015-12-17 ENCOUNTER — Ambulatory Visit (HOSPITAL_COMMUNITY): Payer: BLUE CROSS/BLUE SHIELD

## 2015-12-17 ENCOUNTER — Ambulatory Visit (HOSPITAL_BASED_OUTPATIENT_CLINIC_OR_DEPARTMENT_OTHER): Payer: BLUE CROSS/BLUE SHIELD | Admitting: Anesthesiology

## 2015-12-17 ENCOUNTER — Encounter (HOSPITAL_BASED_OUTPATIENT_CLINIC_OR_DEPARTMENT_OTHER)
Admission: RE | Disposition: A | Discharge status: Home / Self Care | Payer: Self-pay | Source: Ambulatory Visit | Attending: Orthopedic Surgery

## 2015-12-17 ENCOUNTER — Encounter (HOSPITAL_BASED_OUTPATIENT_CLINIC_OR_DEPARTMENT_OTHER): Payer: Self-pay | Admitting: Anesthesiology

## 2015-12-17 ENCOUNTER — Ambulatory Visit (HOSPITAL_BASED_OUTPATIENT_CLINIC_OR_DEPARTMENT_OTHER)
Admission: RE | Admit: 2015-12-17 | Discharge: 2015-12-17 | Disposition: A | Discharge status: Home / Self Care | Payer: BLUE CROSS/BLUE SHIELD | Source: Ambulatory Visit | Attending: Orthopedic Surgery | Admitting: Orthopedic Surgery

## 2015-12-17 DIAGNOSIS — W010XXA Fall on same level from slipping, tripping and stumbling without subsequent striking against object, initial encounter: Secondary | ICD-10-CM | POA: Insufficient documentation

## 2015-12-17 DIAGNOSIS — G43909 Migraine, unspecified, not intractable, without status migrainosus: Secondary | ICD-10-CM | POA: Diagnosis not present

## 2015-12-17 DIAGNOSIS — S52502A Unspecified fracture of the lower end of left radius, initial encounter for closed fracture: Secondary | ICD-10-CM | POA: Diagnosis not present

## 2015-12-17 DIAGNOSIS — M1990 Unspecified osteoarthritis, unspecified site: Secondary | ICD-10-CM | POA: Insufficient documentation

## 2015-12-17 DIAGNOSIS — Z882 Allergy status to sulfonamides status: Secondary | ICD-10-CM | POA: Diagnosis not present

## 2015-12-17 DIAGNOSIS — Y998 Other external cause status: Secondary | ICD-10-CM | POA: Insufficient documentation

## 2015-12-17 DIAGNOSIS — S52572A Other intraarticular fracture of lower end of left radius, initial encounter for closed fracture: Secondary | ICD-10-CM | POA: Insufficient documentation

## 2015-12-17 DIAGNOSIS — S52692A Other fracture of lower end of left ulna, initial encounter for closed fracture: Secondary | ICD-10-CM | POA: Diagnosis not present

## 2015-12-17 DIAGNOSIS — G8918 Other acute postprocedural pain: Secondary | ICD-10-CM | POA: Diagnosis not present

## 2015-12-17 DIAGNOSIS — M79632 Pain in left forearm: Secondary | ICD-10-CM | POA: Diagnosis not present

## 2015-12-17 DIAGNOSIS — Z880 Allergy status to penicillin: Secondary | ICD-10-CM | POA: Insufficient documentation

## 2015-12-17 DIAGNOSIS — G4761 Periodic limb movement disorder: Secondary | ICD-10-CM | POA: Diagnosis not present

## 2015-12-17 DIAGNOSIS — Y9302 Activity, running: Secondary | ICD-10-CM | POA: Diagnosis not present

## 2015-12-17 DIAGNOSIS — Z419 Encounter for procedure for purposes other than remedying health state, unspecified: Secondary | ICD-10-CM

## 2015-12-17 DIAGNOSIS — R0683 Snoring: Secondary | ICD-10-CM | POA: Insufficient documentation

## 2015-12-17 DIAGNOSIS — Y9289 Other specified places as the place of occurrence of the external cause: Secondary | ICD-10-CM | POA: Diagnosis not present

## 2015-12-17 HISTORY — DX: Unspecified fracture of the lower end of unspecified radius, initial encounter for closed fracture: S52.509A

## 2015-12-17 HISTORY — PX: OPEN REDUCTION INTERNAL FIXATION (ORIF) DISTAL RADIAL FRACTURE: SHX5989

## 2015-12-17 SURGERY — OPEN REDUCTION INTERNAL FIXATION (ORIF) DISTAL RADIUS FRACTURE
Anesthesia: General | Site: Wrist | Laterality: Left

## 2015-12-17 MED ORDER — PHENYLEPHRINE 40 MCG/ML (10ML) SYRINGE FOR IV PUSH (FOR BLOOD PRESSURE SUPPORT)
PREFILLED_SYRINGE | INTRAVENOUS | Status: AC
  Filled 2015-12-17: qty 10

## 2015-12-17 MED ORDER — CLINDAMYCIN PHOSPHATE 900 MG/50ML IV SOLN
INTRAVENOUS | Status: AC
  Filled 2015-12-17: qty 50

## 2015-12-17 MED ORDER — CLINDAMYCIN PHOSPHATE 900 MG/50ML IV SOLN
900.0000 mg | INTRAVENOUS | Status: AC
  Administered 2015-12-17: 900 mg via INTRAVENOUS

## 2015-12-17 MED ORDER — PROPOFOL 10 MG/ML IV BOLUS
INTRAVENOUS | Status: AC
  Filled 2015-12-17: qty 20

## 2015-12-17 MED ORDER — LACTATED RINGERS IV SOLN
INTRAVENOUS | Status: DC
  Administered 2015-12-17: 13:00:00 via INTRAVENOUS

## 2015-12-17 MED ORDER — ROPIVACAINE HCL 7.5 MG/ML IJ SOLN
INTRAMUSCULAR | Status: DC | PRN
  Administered 2015-12-17: 20 mL via PERINEURAL

## 2015-12-17 MED ORDER — FENTANYL CITRATE (PF) 100 MCG/2ML IJ SOLN
INTRAMUSCULAR | Status: AC
  Filled 2015-12-17: qty 2

## 2015-12-17 MED ORDER — MIDAZOLAM HCL 2 MG/2ML IJ SOLN
INTRAMUSCULAR | Status: AC
  Filled 2015-12-17: qty 2

## 2015-12-17 MED ORDER — FENTANYL CITRATE (PF) 100 MCG/2ML IJ SOLN
50.0000 ug | INTRAMUSCULAR | Status: DC | PRN
  Administered 2015-12-17: 50 ug via INTRAVENOUS

## 2015-12-17 MED ORDER — SCOPOLAMINE 1 MG/3DAYS TD PT72: 1.0000 | MEDICATED_PATCH | Freq: Once | TRANSDERMAL | Status: DC | PRN

## 2015-12-17 MED ORDER — PROMETHAZINE HCL 25 MG/ML IJ SOLN: 6.2500 mg | INTRAMUSCULAR | Status: DC | PRN

## 2015-12-17 MED ORDER — OXYCODONE HCL 5 MG PO TABS: 5.0000 mg | ORAL_TABLET | ORAL | 0 refills | Status: DC | PRN

## 2015-12-17 MED ORDER — PHENYLEPHRINE HCL 10 MG/ML IJ SOLN
INTRAMUSCULAR | Status: DC | PRN
  Administered 2015-12-17 (×5): 80 ug via INTRAVENOUS

## 2015-12-17 MED ORDER — LIDOCAINE 2% (20 MG/ML) 5 ML SYRINGE
INTRAMUSCULAR | Status: AC
  Filled 2015-12-17: qty 5

## 2015-12-17 MED ORDER — GLYCOPYRROLATE 0.2 MG/ML IJ SOLN: 0.2000 mg | Freq: Once | INTRAMUSCULAR | Status: DC | PRN

## 2015-12-17 MED ORDER — IBUPROFEN 600 MG PO TABS
600.0000 mg | ORAL_TABLET | Freq: Once | ORAL | Status: AC
  Administered 2015-12-17: 600 mg via ORAL

## 2015-12-17 MED ORDER — LIDOCAINE HCL (CARDIAC) 20 MG/ML IV SOLN
INTRAVENOUS | Status: DC | PRN
  Administered 2015-12-17: 30 mg via INTRAVENOUS

## 2015-12-17 MED ORDER — MIDAZOLAM HCL 2 MG/2ML IJ SOLN
1.0000 mg | INTRAMUSCULAR | Status: DC | PRN
  Administered 2015-12-17: 2 mg via INTRAVENOUS

## 2015-12-17 MED ORDER — FENTANYL CITRATE (PF) 100 MCG/2ML IJ SOLN: 25.0000 ug | INTRAMUSCULAR | Status: DC | PRN

## 2015-12-17 MED ORDER — DEXAMETHASONE SODIUM PHOSPHATE 10 MG/ML IJ SOLN
INTRAMUSCULAR | Status: DC | PRN
  Administered 2015-12-17: 10 mg via INTRAVENOUS

## 2015-12-17 MED ORDER — IBUPROFEN 200 MG PO TABS
ORAL_TABLET | ORAL | Status: AC
  Filled 2015-12-17: qty 3

## 2015-12-17 MED ORDER — MIDAZOLAM HCL 5 MG/5ML IJ SOLN
INTRAMUSCULAR | Status: DC | PRN
  Administered 2015-12-17: 2 mg via INTRAVENOUS

## 2015-12-17 MED ORDER — PROPOFOL 10 MG/ML IV BOLUS
INTRAVENOUS | Status: DC | PRN
  Administered 2015-12-17: 150 mg via INTRAVENOUS

## 2015-12-17 MED ORDER — FENTANYL CITRATE (PF) 100 MCG/2ML IJ SOLN
INTRAMUSCULAR | Status: DC | PRN
  Administered 2015-12-17: 100 ug via INTRAVENOUS
  Administered 2015-12-17: 25 ug via INTRAVENOUS

## 2015-12-17 MED ORDER — ONDANSETRON HCL 4 MG/2ML IJ SOLN
INTRAMUSCULAR | Status: DC | PRN
Start: 2015-12-17 — End: 2015-12-17
  Administered 2015-12-17: 4 mg via INTRAVENOUS

## 2015-12-17 MED ORDER — DEXAMETHASONE SODIUM PHOSPHATE 10 MG/ML IJ SOLN
INTRAMUSCULAR | Status: AC
  Filled 2015-12-17: qty 1

## 2015-12-17 MED ORDER — LACTATED RINGERS IV SOLN
INTRAVENOUS | Status: DC
  Administered 2015-12-17 (×2): via INTRAVENOUS

## 2015-12-17 MED ORDER — ONDANSETRON HCL 4 MG/2ML IJ SOLN
INTRAMUSCULAR | Status: AC
  Filled 2015-12-17: qty 2

## 2015-12-17 SURGICAL SUPPLY — 78 items
BANDAGE COBAN STERILE 2 (GAUZE/BANDAGES/DRESSINGS) IMPLANT
BIT DRILL 2.2 SS TIBIAL (BIT) ×3 IMPLANT
BLADE MINI RND TIP GREEN BEAV (BLADE) IMPLANT
BLADE SURG 15 STRL LF DISP TIS (BLADE) ×2 IMPLANT
BLADE SURG 15 STRL SS (BLADE) ×4
BNDG COHESIVE 4X5 TAN STRL (GAUZE/BANDAGES/DRESSINGS) ×3 IMPLANT
BNDG ESMARK 4X9 LF (GAUZE/BANDAGES/DRESSINGS) ×3 IMPLANT
BNDG GAUZE ELAST 4 BULKY (GAUZE/BANDAGES/DRESSINGS) ×3 IMPLANT
BRUSH SCRUB EZ PLAIN DRY (MISCELLANEOUS) ×3 IMPLANT
CANISTER SUCT 1200ML W/VALVE (MISCELLANEOUS) ×3 IMPLANT
CHLORAPREP W/TINT 26ML (MISCELLANEOUS) ×3 IMPLANT
CORDS BIPOLAR (ELECTRODE) ×3 IMPLANT
COVER BACK TABLE 60X90IN (DRAPES) ×3 IMPLANT
COVER MAYO STAND STRL (DRAPES) ×3 IMPLANT
CUFF TOURNIQUET SINGLE 18IN (TOURNIQUET CUFF) ×3 IMPLANT
CUFF TOURNIQUET SINGLE 24IN (TOURNIQUET CUFF) IMPLANT
DRAPE C-ARM 42X72 X-RAY (DRAPES) ×3 IMPLANT
DRAPE EXTREMITY T 121X128X90 (DRAPE) ×3 IMPLANT
DRAPE SURG 17X23 STRL (DRAPES) ×3 IMPLANT
DRSG ADAPTIC 3X8 NADH LF (GAUZE/BANDAGES/DRESSINGS) ×3 IMPLANT
DRSG EMULSION OIL 3X3 NADH (GAUZE/BANDAGES/DRESSINGS) IMPLANT
ELECT REM PT RETURN 9FT ADLT (ELECTROSURGICAL) ×3
ELECTRODE REM PT RTRN 9FT ADLT (ELECTROSURGICAL) ×1 IMPLANT
GLOVE BIO SURGEON STRL SZ7.5 (GLOVE) ×3 IMPLANT
GLOVE BIOGEL PI IND STRL 7.0 (GLOVE) ×4 IMPLANT
GLOVE BIOGEL PI IND STRL 8 (GLOVE) ×1 IMPLANT
GLOVE BIOGEL PI INDICATOR 7.0 (GLOVE) ×8
GLOVE BIOGEL PI INDICATOR 8 (GLOVE) ×2
GLOVE ECLIPSE 6.5 STRL STRAW (GLOVE) ×9 IMPLANT
GOWN STRL REUS W/ TWL LRG LVL3 (GOWN DISPOSABLE) ×2 IMPLANT
GOWN STRL REUS W/TWL LRG LVL3 (GOWN DISPOSABLE) ×4
GOWN STRL REUS W/TWL XL LVL3 (GOWN DISPOSABLE) ×3 IMPLANT
K-WIRE 1.6 (WIRE) ×2
K-WIRE FX5X1.6XNS BN SS (WIRE) ×1
KWIRE FX5X1.6XNS BN SS (WIRE) ×1 IMPLANT
NEEDLE HYPO 25X1 1.5 SAFETY (NEEDLE) IMPLANT
NS IRRIG 1000ML POUR BTL (IV SOLUTION) ×3 IMPLANT
PACK BASIN DAY SURGERY FS (CUSTOM PROCEDURE TRAY) ×3 IMPLANT
PADDING CAST ABS 4INX4YD NS (CAST SUPPLIES)
PADDING CAST ABS COTTON 4X4 ST (CAST SUPPLIES) IMPLANT
PEG LOCKING SMOOTH 2.2X14 (Peg) ×3 IMPLANT
PEG LOCKING SMOOTH 2.2X16 (Screw) ×3 IMPLANT
PEG LOCKING SMOOTH 2.2X18 (Peg) ×9 IMPLANT
PEG LOCKING SMOOTH 2.2X20 (Screw) ×3 IMPLANT
PENCIL BUTTON HOLSTER BLD 10FT (ELECTRODE) ×3 IMPLANT
PLATE STANDARD DVR LEFT (Plate) ×3 IMPLANT
PLATE STD DVR LT 24X51 (Plate) ×1 IMPLANT
RUBBERBAND STERILE (MISCELLANEOUS) IMPLANT
SCREW  LP NL 2.7X13MM (Screw) ×2 IMPLANT
SCREW LOCK 12X2.7X 3 LD (Screw) ×1 IMPLANT
SCREW LOCK 14X2.7X 3 LD TPR (Screw) ×1 IMPLANT
SCREW LOCKING 2.7X11MM (Screw) ×3 IMPLANT
SCREW LOCKING 2.7X12MM (Screw) ×2 IMPLANT
SCREW LOCKING 2.7X13MM (Screw) ×3 IMPLANT
SCREW LOCKING 2.7X14 (Screw) ×2 IMPLANT
SCREW LP NL 2.7X13MM (Screw) ×1 IMPLANT
SCREW MULTI DIRECTIONAL 2.7X18 (Screw) ×3 IMPLANT
SLEEVE SCD COMPRESS KNEE MED (MISCELLANEOUS) ×3 IMPLANT
SLING ARM FOAM STRAP LRG (SOFTGOODS) ×3 IMPLANT
SPLINT FIBERGLASS 3X35 (CAST SUPPLIES) ×3 IMPLANT
SPLINT PLASTER CAST XFAST 3X15 (CAST SUPPLIES) ×10 IMPLANT
SPLINT PLASTER XTRA FASTSET 3X (CAST SUPPLIES) ×20
SPONGE GAUZE 4X4 12PLY STER LF (GAUZE/BANDAGES/DRESSINGS) ×3 IMPLANT
STOCKINETTE 6  STRL (DRAPES) ×2
STOCKINETTE 6 STRL (DRAPES) ×1 IMPLANT
SUCTION FRAZIER HANDLE 10FR (MISCELLANEOUS) ×2
SUCTION TUBE FRAZIER 10FR DISP (MISCELLANEOUS) ×1 IMPLANT
SUT VIC AB 2-0 PS2 27 (SUTURE) ×3 IMPLANT
SUT VICRYL 4-0 PS2 18IN ABS (SUTURE) IMPLANT
SUT VICRYL RAPIDE 4-0 (SUTURE) IMPLANT
SUT VICRYL RAPIDE 4/0 PS 2 (SUTURE) ×3 IMPLANT
SYR 10ML LL (SYRINGE) IMPLANT
SYR BULB 3OZ (MISCELLANEOUS) ×3 IMPLANT
TOWEL OR 17X24 6PK STRL BLUE (TOWEL DISPOSABLE) ×3 IMPLANT
TOWEL OR NON WOVEN STRL DISP B (DISPOSABLE) ×3 IMPLANT
TUBE CONNECTING 20'X1/4 (TUBING) ×1
TUBE CONNECTING 20X1/4 (TUBING) ×2 IMPLANT
UNDERPAD 30X30 (UNDERPADS AND DIAPERS) ×3 IMPLANT

## 2015-12-17 NOTE — Anesthesia Postprocedure Evaluation (Signed)
Anesthesia Post Note  Patient: Lorraine Owen  Procedure(s) Performed: Procedure(s) (LRB): OPEN TREATMENT OF LEFT DISTAL RADIUS FRACTURE WITH POSSIBLE OPEN TREATMENT OF LEFT DISTAL ULNA FRACTURE (Left)  Patient location during evaluation: PACU Anesthesia Type: General Level of consciousness: awake Pain management: pain level controlled Vital Signs Assessment: post-procedure vital signs reviewed and stable Respiratory status: spontaneous breathing Cardiovascular status: stable Anesthetic complications: no    Last Vitals:  Vitals:   12/17/15 1454 12/17/15 1500  BP: 120/78 111/75  Pulse: 92 77  Resp: 12 13  Temp: 36.9 C     Last Pain:  Vitals:   12/17/15 1454  TempSrc:   PainSc: 0-No pain                 EDWARDS,Shenelle Klas

## 2015-12-17 NOTE — Anesthesia Procedure Notes (Signed)
Procedure Name: LMA Insertion Date/Time: 12/17/2015 1:46 PM Performed by: Toula Moos L Pre-anesthesia Checklist: Patient identified, Emergency Drugs available, Suction available, Patient being monitored and Timeout performed Patient Re-evaluated:Patient Re-evaluated prior to inductionOxygen Delivery Method: Circle system utilized Preoxygenation: Pre-oxygenation with 100% oxygen Intubation Type: IV induction Ventilation: Mask ventilation without difficulty LMA: LMA inserted LMA Size: 4.0 Number of attempts: 1 Airway Equipment and Method: Bite block Placement Confirmation: positive ETCO2 Tube secured with: Tape Dental Injury: Teeth and Oropharynx as per pre-operative assessment

## 2015-12-17 NOTE — Discharge Instructions (Signed)
Discharge Instructions   You have a dressing with a plaster splint incorporated in it. Move your fingers as much as possible, making a full fist and fully opening the fist. Elevate your hand above your heart to reduce pain & swelling of the digits.  Ice over the operative site and/or arm pit may be helpful to reduce pain & swelling.  DO NOT USE HEAT. Pain medicine has been prescribed for you.  Use your medicine as needed over the first 48 hours for severe breakthrough pain, and then you can begin to taper your use. Take 800 mg ibuprofen over the counter 3x/day and tylenol as it states on the bottle.  Leave the dressing in place until you return to our office.  You may shower, but keep the bandage clean & dry.  You may drive a car when you are off of prescription pain medications and can safely control your vehicle with both hands. Our office will call you to arrange follow-up   Please call [phone] during normal business hours or [phone] after hours for any problems. Including the following:  - excessive redness of the incisions - drainage for more than 4 days - fever of more than 101.5 F  *Please note that pain medications will not be refilled after hours or on weekends.    Post Anesthesia Home Care Instructions  Activity: Get plenty of rest for the remainder of the day. A responsible adult should stay with you for 24 hours following the procedure.  For the next 24 hours, DO NOT: -Drive a car -Paediatric nurse -Drink alcoholic beverages -Take any medication unless instructed by your physician -Make any legal decisions or sign important papers.  Meals: Start with liquid foods such as gelatin or soup. Progress to regular foods as tolerated. Avoid greasy, spicy, heavy foods. If nausea and/or vomiting occur, drink only clear liquids until the nausea and/or vomiting subsides. Call your physician if vomiting continues.  Special Instructions/Symptoms: Your throat may feel  dry or sore from the anesthesia or the breathing tube placed in your throat during surgery. If this causes discomfort, gargle with warm salt water. The discomfort should disappear within 24 hours.  If you had a scopolamine patch placed behind your ear for the management of post- operative nausea and/or vomiting:  1. The medication in the patch is effective for 72 hours, after which it should be removed.  Wrap patch in a tissue and discard in the trash. Wash hands thoroughly with soap and water. 2. You may remove the patch earlier than 72 hours if you experience unpleasant side effects which may include dry mouth, dizziness or visual disturbances. 3. Avoid touching the patch. Wash your hands with soap and water after contact with the patch.   Regional Anesthesia Blocks  1. Numbness or the inability to move the "blocked" extremity may last from 3-48 hours after placement. The length of time depends on the medication injected and your individual response to the medication. If the numbness is not going away after 48 hours, call your surgeon.  2. The extremity that is blocked will need to be protected until the numbness is gone and the  Strength has returned. Because you cannot feel it, you will need to take extra care to avoid injury. Because it may be weak, you may have difficulty moving it or using it. You may not know what position it is in without looking at it while the block is in effect.  3. For blocks in the legs and  feet, returning to weight bearing and walking needs to be done carefully. You will need to wait until the numbness is entirely gone and the strength has returned. You should be able to move your leg and foot normally before you try and bear weight or walk. You will need someone to be with you when you first try to ensure you do not fall and possibly risk injury.  4. Bruising and tenderness at the needle site are common side effects and will resolve in a few days.  5. Persistent  numbness or new problems with movement should be communicated to the surgeon or the [place] [phone] [place] [phone]).

## 2015-12-17 NOTE — Interval H&P Note (Signed)
History and Physical Interval Note:  12/17/2015 1:38 PM  Lorraine Owen  has presented today for surgery, with the diagnosis of LEFT DISTAL RADIUS AND ULNA FRACTURES S52.572A, [id number]  The various methods of treatment have been discussed with the patient and family. After consideration of risks, benefits and other options for treatment, the patient has consented to  Procedure(s) with comments: OPEN TREATMENT OF LEFT DISTAL RADIUS FRACTURE WITH POSSIBLE OPEN TREATMENT OF LEFT DISTAL ULNA FRACTURE (Left) - GENERAL ANESTHESIA WITH PRE-OP BLOCK as a surgical intervention .  The patient's history has been reviewed, patient examined, no change in status, stable for surgery.  I have reviewed the patient's chart and labs.  Questions were answered to the patient's satisfaction.     Minetta Krisher A.

## 2015-12-17 NOTE — Anesthesia Procedure Notes (Signed)
Anesthesia Regional Block:  Supraclavicular block  Pre-Anesthetic Checklist: ,, timeout performed, Correct Patient, Correct Site, Correct Laterality, Correct Procedure, Correct Position, site marked, Risks and benefits discussed,  Surgical consent,  Pre-op evaluation,  At surgeon's request and post-op pain management  Laterality: Left  Prep: chloraprep       Needles:  Injection technique: Single-shot  Needle Type: Echogenic Stimulator Needle     Needle Length: 5cm 5 cm Needle Gauge: 22 and 22 G    Additional Needles:  Procedures: ultrasound guided (picture in chart) Supraclavicular block Narrative:  Injection made incrementally with aspirations every 5 mL.  Performed by: Personally  Anesthesiologist: Catalina Gravel  Additional Notes: Functioning IV was confirmed and monitors were applied.  A 78mm 22ga Arrow echogenic stimulator needle was used. Sterile prep and drape, hand hygiene, and sterile gloves were used.  Negative aspiration and negative test dose prior to incremental administration of local anesthetic. The patient tolerated the procedure well.  Ultrasound guidance: relevent anatomy identified, needle position confirmed, local anesthetic spread visualized around nerve(s), vascular puncture avoided.  Image printed for medical record.

## 2015-12-17 NOTE — Transfer of Care (Signed)
Immediate Anesthesia Transfer of Care Note  Patient: Lorraine Owen  Procedure(s) Performed: Procedure(s) with comments: OPEN TREATMENT OF LEFT DISTAL RADIUS FRACTURE WITH POSSIBLE OPEN TREATMENT OF LEFT DISTAL ULNA FRACTURE (Left) - GENERAL ANESTHESIA WITH PRE-OP BLOCK  Patient Location: PACU  Anesthesia Type:GA combined with regional for post-op pain  Level of Consciousness: awake and patient cooperative  Airway & Oxygen Therapy: Patient Spontanous Breathing and Patient connected to face mask oxygen  Post-op Assessment: Report given to RN and Post -op Vital signs reviewed and stable  Post vital signs: Reviewed and stable  Last Vitals:  Vitals:   12/17/15 1305 12/17/15 1310  BP: 123/73 112/71  Pulse: 85 77  Resp: 20 15  Temp:      Last Pain:  Vitals:   12/17/15 1235  TempSrc: Oral  PainSc: 2       Patients Stated Pain Goal: 4 ([id number])  Complications: No apparent anesthesia complications

## 2015-12-17 NOTE — Op Note (Signed)
12/17/2015  1:38 PM  PATIENT:  Lorraine Owen  55 y.o. female  PRE-OPERATIVE DIAGNOSIS:  Displaced left distal radius and ulna fractures  POST-OPERATIVE DIAGNOSIS:  Same  PROCEDURE:  ORIF left distal radius intra-articular 3-part fracture [id number]) and Closed treatment of left distal ulna fracture  SURGEON: Rayvon Char. Grandville Silos, MD  PHYSICIAN ASSISTANT: Morley Kos, OPA-C  ANESTHESIA:  regional and general  SPECIMENS:  None  DRAINS: None  EBL:  less than 50 mL  PREOPERATIVE INDICATIONS:  Lorraine Owen is a  55 y.o. female with a displaced left intra-articular distal radius fracture and extra-articular left distal ulna fracture.  The ulna fracture was discernible, but otherwise minimally displaced.  The risks benefits and alternatives were discussed with the patient preoperatively including but not limited to the risks of infection, bleeding, nerve injury, cardiopulmonary complications, the need for revision surgery, among others, and the patient verbalized understanding and consented to proceed.  OPERATIVE IMPLANTS: Biomet DVR cross lock plate/screws/pegs  OPERATIVE PROCEDURE: After receiving prophylactic antibiotics and a regional block, the patient was escorted to the operative theatre and placed in a supine position.  General anesthesia was administered.  A surgical "time-out" was performed during which the planned procedure, proposed operative site, and the correct patient identity were compared to the operative consent and agreement confirmed by the circulating nurse according to current facility policy. Following application of a tourniquet to the operative extremity, the exposed skin was pre-scrubbed with a Hibiclens scrub brush and then was prepped with Chloraprep and draped in the usual sterile fashion. The limb was exsanguinated with an Esmarch bandage and the tourniquet inflated to approximately 133mmHg higher than systolic BP.   A sinusoidal-shaped incision was  marked and made over the FCR axis and the distal forearm. The skin was incised sharply with scalpel, subcutaneous tissues with blunt and spreading dissection. The FCR axis was exploited deeply. The pronator quadratus was reflected in an L-shaped ulnarly and the brachioradialis was split in a Z-plasty fashion for later reapproximation. The fracture was inspected .The appropriately sized plate was selected and found to fit well. It was placed in its provisional alignment of the radius and this was confirmed fluoroscopically.  It was secured to the radius with a screw through the slotted hole.  Because a portion of the fracture was volarly displaced, a K wire was driven through a K wire hole on the shaft portion of the plate and then a large clamp used to reapproximate the plate to the volar surface of the bone, thus helping to reduce the volarly displaced volar fragments.  Additional adjustments were made as necessary, and the distal holes were all drilled and filled with the distal radius having applied traction upon it, gaining radial length and appropriate tilt to the dorsal fragments.  Peg/screw length distally was selected on the shorter side of measurements to minimize the risk for dorsal cortical penetration. The remainder of the proximal holes were drilled and filled.   Final images were obtained and the DRUJ was examined for stability.  The distal ulna fracture was now not discernible on the C-arm images and the distal ulna remains stable when wrist range of motion to include pronation supination was visualized under live fluoroscopy.  The decision was made to treat the distal ulna fracture without operative stabilization.  The wound was then copiously irrigated and the brachioradialis repaired with 2-0 Vicryl Rapide suture followed by repair of the pronator quadratus with the same suture type. Tourniquet was released and additional  hemostasis obtained and the skin was closed with 2-0 Vicryl deep dermal  buried sutures followed by running 4-0 Vicryl Rapide horizontal mattress suture in the skin. A bulky dressing with a sugar tong fiberglass component was applied and she was taken to room stable condition.  DISPOSITION: The patient will be discharged home today with typical post-op instructions, returning in 10-15 days for reevaluation with new x-rays of the affected wrist out of the splint to include an inclined lateral and then transition to therapy to have a custom short arm splint constructed and begin rehabilitation.

## 2015-12-17 NOTE — Anesthesia Preprocedure Evaluation (Signed)
Anesthesia Evaluation  Patient identified by MRN, date of birth, ID band Patient awake    Reviewed: Allergy & Precautions, NPO status , Patient's Chart, lab work & pertinent test results  Airway Mallampati: II  TM Distance: >3 FB Neck ROM: Full    Dental  (+) Teeth Intact, Dental Advisory Given   Pulmonary neg pulmonary ROS,    Pulmonary exam normal breath sounds clear to auscultation       Cardiovascular Exercise Tolerance: Good negative cardio ROS Normal cardiovascular exam Rhythm:Regular Rate:Normal     Neuro/Psych  Headaches,    GI/Hepatic negative GI ROS, Neg liver ROS,   Endo/Other  negative endocrine ROS  Renal/GU negative Renal ROS     Musculoskeletal  (+) Arthritis ,   Abdominal   Peds  Hematology negative hematology ROS (+)   Anesthesia Other Findings Day of surgery medications reviewed with the patient.  Breast cancer  Reproductive/Obstetrics                             Anesthesia Physical Anesthesia Plan  ASA: II  Anesthesia Plan: General   Post-op Pain Management:  Regional for Post-op pain   Induction: Intravenous  Airway Management Planned: LMA  Additional Equipment:   Intra-op Plan:   Post-operative Plan: Extubation in OR  Informed Consent: I have reviewed the patients History and Physical, chart, labs and discussed the procedure including the risks, benefits and alternatives for the proposed anesthesia with the patient or authorized representative who has indicated his/her understanding and acceptance.   Dental advisory given  Plan Discussed with: CRNA  Anesthesia Plan Comments: (Risks/benefits of general anesthesia discussed with patient including risk of damage to teeth, lips, gum, and tongue, nausea/vomiting, allergic reactions to medications, and the possibility of heart attack, stroke and death.  All patient questions answered.  Patient wishes to  proceed.)        Anesthesia Quick Evaluation

## 2015-12-17 NOTE — Progress Notes (Signed)
Assisted Dr. Turk with left, ultrasound guided, supraclavicular block. Side rails up, monitors on throughout procedure. See vital signs in flow sheet. Tolerated Procedure well. 

## 2015-12-18 ENCOUNTER — Encounter (HOSPITAL_BASED_OUTPATIENT_CLINIC_OR_DEPARTMENT_OTHER): Payer: Self-pay | Admitting: Orthopedic Surgery

## 2015-12-19 ENCOUNTER — Encounter (HOSPITAL_BASED_OUTPATIENT_CLINIC_OR_DEPARTMENT_OTHER): Payer: Self-pay | Admitting: Orthopedic Surgery

## 2016-01-02 DIAGNOSIS — S52502D Unspecified fracture of the lower end of left radius, subsequent encounter for closed fracture with routine healing: Secondary | ICD-10-CM | POA: Diagnosis not present

## 2016-01-02 DIAGNOSIS — S52572D Other intraarticular fracture of lower end of left radius, subsequent encounter for closed fracture with routine healing: Secondary | ICD-10-CM | POA: Diagnosis not present

## 2016-01-02 DIAGNOSIS — S52692D Other fracture of lower end of left ulna, subsequent encounter for closed fracture with routine healing: Secondary | ICD-10-CM | POA: Diagnosis not present

## 2016-01-13 DIAGNOSIS — S52502D Unspecified fracture of the lower end of left radius, subsequent encounter for closed fracture with routine healing: Secondary | ICD-10-CM | POA: Diagnosis not present

## 2016-01-20 DIAGNOSIS — S52502D Unspecified fracture of the lower end of left radius, subsequent encounter for closed fracture with routine healing: Secondary | ICD-10-CM | POA: Diagnosis not present

## 2016-01-29 DIAGNOSIS — S52572D Other intraarticular fracture of lower end of left radius, subsequent encounter for closed fracture with routine healing: Secondary | ICD-10-CM | POA: Diagnosis not present

## 2016-01-29 DIAGNOSIS — S52692D Other fracture of lower end of left ulna, subsequent encounter for closed fracture with routine healing: Secondary | ICD-10-CM | POA: Diagnosis not present

## 2016-02-03 DIAGNOSIS — S52502D Unspecified fracture of the lower end of left radius, subsequent encounter for closed fracture with routine healing: Secondary | ICD-10-CM | POA: Diagnosis not present

## 2016-02-11 DIAGNOSIS — S52502D Unspecified fracture of the lower end of left radius, subsequent encounter for closed fracture with routine healing: Secondary | ICD-10-CM | POA: Diagnosis not present

## 2016-02-28 ENCOUNTER — Telehealth: Payer: Self-pay

## 2016-02-28 NOTE — Telephone Encounter (Signed)
Lorraine Owen  Patient called yesterday (did not see note) regarding a new medication you were going to put her on.   Please call her [phone]

## 2016-02-28 NOTE — Telephone Encounter (Signed)
I dont see her as our patient?

## 2016-03-01 NOTE — Telephone Encounter (Signed)
I do not know this patient - it is always fine in the future for you to contact the patient and get information before you send it to me.  I suspect this is a mistake on the patient's end.

## 2016-03-02 NOTE — Telephone Encounter (Signed)
N/a on pt phone

## 2016-03-10 DIAGNOSIS — S52502D Unspecified fracture of the lower end of left radius, subsequent encounter for closed fracture with routine healing: Secondary | ICD-10-CM | POA: Diagnosis not present

## 2016-04-20 DIAGNOSIS — M205X1 Other deformities of toe(s) (acquired), right foot: Secondary | ICD-10-CM | POA: Diagnosis not present

## 2016-04-20 DIAGNOSIS — M205X2 Other deformities of toe(s) (acquired), left foot: Secondary | ICD-10-CM | POA: Diagnosis not present

## 2016-04-27 DIAGNOSIS — M205X2 Other deformities of toe(s) (acquired), left foot: Secondary | ICD-10-CM | POA: Diagnosis not present

## 2016-05-01 DIAGNOSIS — M205X2 Other deformities of toe(s) (acquired), left foot: Secondary | ICD-10-CM | POA: Diagnosis not present

## 2016-05-27 DIAGNOSIS — M21962 Unspecified acquired deformity of left lower leg: Secondary | ICD-10-CM | POA: Diagnosis not present

## 2016-05-27 DIAGNOSIS — M21961 Unspecified acquired deformity of right lower leg: Secondary | ICD-10-CM | POA: Diagnosis not present

## 2016-07-16 DIAGNOSIS — J069 Acute upper respiratory infection, unspecified: Secondary | ICD-10-CM | POA: Diagnosis not present

## 2016-07-30 DIAGNOSIS — Z124 Encounter for screening for malignant neoplasm of cervix: Secondary | ICD-10-CM | POA: Diagnosis not present

## 2016-07-30 DIAGNOSIS — Z01419 Encounter for gynecological examination (general) (routine) without abnormal findings: Secondary | ICD-10-CM | POA: Diagnosis not present

## 2016-07-30 DIAGNOSIS — Z6823 Body mass index (BMI) 23.0-23.9, adult: Secondary | ICD-10-CM | POA: Diagnosis not present

## 2016-07-30 DIAGNOSIS — Z1231 Encounter for screening mammogram for malignant neoplasm of breast: Secondary | ICD-10-CM | POA: Diagnosis not present

## 2016-09-02 DIAGNOSIS — M79672 Pain in left foot: Secondary | ICD-10-CM | POA: Diagnosis not present

## 2016-09-02 DIAGNOSIS — M79671 Pain in right foot: Secondary | ICD-10-CM | POA: Diagnosis not present

## 2016-09-02 DIAGNOSIS — M722 Plantar fascial fibromatosis: Secondary | ICD-10-CM | POA: Diagnosis not present

## 2016-11-12 DIAGNOSIS — R0789 Other chest pain: Secondary | ICD-10-CM | POA: Diagnosis not present

## 2016-11-12 DIAGNOSIS — Z1212 Encounter for screening for malignant neoplasm of rectum: Secondary | ICD-10-CM | POA: Diagnosis not present

## 2016-11-12 DIAGNOSIS — Z1211 Encounter for screening for malignant neoplasm of colon: Secondary | ICD-10-CM | POA: Diagnosis not present

## 2016-11-12 DIAGNOSIS — Z6821 Body mass index (BMI) 21.0-21.9, adult: Secondary | ICD-10-CM | POA: Diagnosis not present

## 2016-11-12 DIAGNOSIS — R05 Cough: Secondary | ICD-10-CM | POA: Diagnosis not present

## 2017-03-02 DIAGNOSIS — G8929 Other chronic pain: Secondary | ICD-10-CM | POA: Diagnosis not present

## 2017-03-02 DIAGNOSIS — M25811 Other specified joint disorders, right shoulder: Secondary | ICD-10-CM | POA: Diagnosis not present

## 2017-03-05 ENCOUNTER — Other Ambulatory Visit: Payer: Self-pay | Admitting: Orthopedic Surgery

## 2017-03-05 DIAGNOSIS — G8929 Other chronic pain: Secondary | ICD-10-CM

## 2017-03-05 DIAGNOSIS — M25511 Pain in right shoulder: Principal | ICD-10-CM

## 2017-04-01 ENCOUNTER — Ambulatory Visit
Admission: RE | Admit: 2017-04-01 | Discharge: 2017-04-01 | Disposition: A | Discharge status: Home / Self Care | Payer: BLUE CROSS/BLUE SHIELD | Source: Ambulatory Visit | Attending: Orthopedic Surgery | Admitting: Orthopedic Surgery

## 2017-04-01 DIAGNOSIS — G8929 Other chronic pain: Secondary | ICD-10-CM

## 2017-04-01 DIAGNOSIS — M25511 Pain in right shoulder: Secondary | ICD-10-CM | POA: Diagnosis not present

## 2017-04-01 DIAGNOSIS — M67813 Other specified disorders of tendon, right shoulder: Secondary | ICD-10-CM | POA: Diagnosis not present

## 2017-04-01 MED ORDER — IOPAMIDOL (ISOVUE-M 200) INJECTION 41%
15.0000 mL | Freq: Once | INTRAMUSCULAR | Status: AC
  Administered 2017-04-01: 15 mL via INTRA_ARTICULAR

## 2017-04-29 DIAGNOSIS — S46811D Strain of other muscles, fascia and tendons at shoulder and upper arm level, right arm, subsequent encounter: Secondary | ICD-10-CM | POA: Diagnosis not present

## 2017-04-29 DIAGNOSIS — M7541 Impingement syndrome of right shoulder: Secondary | ICD-10-CM | POA: Diagnosis not present

## 2017-06-02 DIAGNOSIS — M75121 Complete rotator cuff tear or rupture of right shoulder, not specified as traumatic: Secondary | ICD-10-CM | POA: Diagnosis not present

## 2017-06-02 DIAGNOSIS — S43431A Superior glenoid labrum lesion of right shoulder, initial encounter: Secondary | ICD-10-CM | POA: Diagnosis not present

## 2017-06-02 DIAGNOSIS — M19011 Primary osteoarthritis, right shoulder: Secondary | ICD-10-CM | POA: Diagnosis not present

## 2017-06-02 DIAGNOSIS — M7541 Impingement syndrome of right shoulder: Secondary | ICD-10-CM | POA: Diagnosis not present

## 2017-06-02 DIAGNOSIS — M24111 Other articular cartilage disorders, right shoulder: Secondary | ICD-10-CM | POA: Diagnosis not present

## 2017-06-02 DIAGNOSIS — G8918 Other acute postprocedural pain: Secondary | ICD-10-CM | POA: Diagnosis not present

## 2017-06-02 DIAGNOSIS — S43421A Sprain of right rotator cuff capsule, initial encounter: Secondary | ICD-10-CM | POA: Diagnosis not present

## 2017-06-08 DIAGNOSIS — M25611 Stiffness of right shoulder, not elsewhere classified: Secondary | ICD-10-CM | POA: Diagnosis not present

## 2017-06-08 DIAGNOSIS — M7541 Impingement syndrome of right shoulder: Secondary | ICD-10-CM | POA: Diagnosis not present

## 2017-06-08 DIAGNOSIS — R6889 Other general symptoms and signs: Secondary | ICD-10-CM | POA: Diagnosis not present

## 2017-06-08 DIAGNOSIS — R29898 Other symptoms and signs involving the musculoskeletal system: Secondary | ICD-10-CM | POA: Diagnosis not present

## 2017-06-17 DIAGNOSIS — R6889 Other general symptoms and signs: Secondary | ICD-10-CM | POA: Diagnosis not present

## 2017-06-17 DIAGNOSIS — R29898 Other symptoms and signs involving the musculoskeletal system: Secondary | ICD-10-CM | POA: Diagnosis not present

## 2017-06-17 DIAGNOSIS — Z9889 Other specified postprocedural states: Secondary | ICD-10-CM | POA: Diagnosis not present

## 2017-06-17 DIAGNOSIS — M25611 Stiffness of right shoulder, not elsewhere classified: Secondary | ICD-10-CM | POA: Diagnosis not present

## 2017-06-23 DIAGNOSIS — Z9889 Other specified postprocedural states: Secondary | ICD-10-CM | POA: Diagnosis not present

## 2017-06-23 DIAGNOSIS — R6889 Other general symptoms and signs: Secondary | ICD-10-CM | POA: Diagnosis not present

## 2017-06-23 DIAGNOSIS — R29898 Other symptoms and signs involving the musculoskeletal system: Secondary | ICD-10-CM | POA: Diagnosis not present

## 2017-06-23 DIAGNOSIS — M25611 Stiffness of right shoulder, not elsewhere classified: Secondary | ICD-10-CM | POA: Diagnosis not present

## 2017-06-30 DIAGNOSIS — R29898 Other symptoms and signs involving the musculoskeletal system: Secondary | ICD-10-CM | POA: Diagnosis not present

## 2017-06-30 DIAGNOSIS — Z9889 Other specified postprocedural states: Secondary | ICD-10-CM | POA: Diagnosis not present

## 2017-06-30 DIAGNOSIS — M25611 Stiffness of right shoulder, not elsewhere classified: Secondary | ICD-10-CM | POA: Diagnosis not present

## 2017-06-30 DIAGNOSIS — R6889 Other general symptoms and signs: Secondary | ICD-10-CM | POA: Diagnosis not present

## 2017-07-08 DIAGNOSIS — R29898 Other symptoms and signs involving the musculoskeletal system: Secondary | ICD-10-CM | POA: Diagnosis not present

## 2017-07-08 DIAGNOSIS — M25611 Stiffness of right shoulder, not elsewhere classified: Secondary | ICD-10-CM | POA: Diagnosis not present

## 2017-07-08 DIAGNOSIS — Z9889 Other specified postprocedural states: Secondary | ICD-10-CM | POA: Diagnosis not present

## 2017-07-08 DIAGNOSIS — R6889 Other general symptoms and signs: Secondary | ICD-10-CM | POA: Diagnosis not present

## 2017-07-22 DIAGNOSIS — Z9889 Other specified postprocedural states: Secondary | ICD-10-CM | POA: Diagnosis not present

## 2017-07-22 DIAGNOSIS — R6889 Other general symptoms and signs: Secondary | ICD-10-CM | POA: Diagnosis not present

## 2017-07-22 DIAGNOSIS — M25611 Stiffness of right shoulder, not elsewhere classified: Secondary | ICD-10-CM | POA: Diagnosis not present

## 2017-07-22 DIAGNOSIS — R29898 Other symptoms and signs involving the musculoskeletal system: Secondary | ICD-10-CM | POA: Diagnosis not present

## 2018-01-31 ENCOUNTER — Telehealth: Payer: Self-pay | Admitting: Family Medicine

## 2018-01-31 ENCOUNTER — Ambulatory Visit: Payer: Self-pay | Admitting: Family Medicine

## 2018-01-31 VITALS — BP 100/70 | HR 69 | Temp 98.4°F | Resp 16 | Wt 144.6 lb

## 2018-01-31 DIAGNOSIS — H109 Unspecified conjunctivitis: Secondary | ICD-10-CM

## 2018-01-31 MED ORDER — CIPROFLOXACIN HCL 0.3 % OP SOLN: 1.0000 [drp] | OPHTHALMIC | 0 refills | Status: AC

## 2018-01-31 NOTE — Telephone Encounter (Signed)
Called patient to let her know medication prescribed was sent as discussed to preferred pharmacy since patient left prior to provider discharge instruction explanations. No answer-  No message left.

## 2018-01-31 NOTE — Progress Notes (Signed)
Lorraine Owen is a 57 y.o. female who presents today with concerns of eye redness for 24 hours. She report her daughter has a similar symptom the week prior which spontaneously resolved without treatment. She reports that her eye was crusty and stuck shut this am. 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  Review of Systems  Constitutional: Negative for chills, fever and malaise/fatigue.  HENT: Negative for congestion, ear discharge, ear pain, sinus pain and sore throat.   Eyes: Positive for discharge and redness. Negative for blurred vision, double vision, photophobia and pain.  Respiratory: Negative for cough, sputum production and shortness of breath.   Cardiovascular: Negative.  Negative for chest pain.  Gastrointestinal: Negative for abdominal pain, diarrhea, nausea and vomiting.  Genitourinary: Negative for dysuria, frequency, hematuria and urgency.  Musculoskeletal: Negative for myalgias.  Skin: Negative.  Negative for itching and rash.  Neurological: Negative for headaches.  Endo/Heme/Allergies: Negative.   Psychiatric/Behavioral: Negative.     O: Vitals:   01/31/18 1549  BP: 100/70  Pulse: 69  Resp: 16  Temp: 98.4 F (36.9 C)  SpO2: 97%     Physical Exam Vitals signs reviewed.  Constitutional:      Appearance: She is well-developed. She is not toxic-appearing.  HENT:     Head: Normocephalic.     Right Ear: Hearing, tympanic membrane, ear canal and external ear normal.     Left Ear: Hearing, tympanic membrane, ear canal and external ear normal.     Nose: Nose normal.     Mouth/Throat:     Pharynx: Uvula midline.  Eyes:     General: Lids are normal.     Extraocular Movements: Extraocular movements intact.     Conjunctiva/sclera:     Right eye: Right conjunctiva is injected.      Comments: Mild lower lid conjuctival erythema- eye exam otherwise WNL  Neck:     Musculoskeletal: Normal range  of motion and neck supple.  Cardiovascular:     Rate and Rhythm: Normal rate and regular rhythm.     Pulses: Normal pulses.     Heart sounds: Normal heart sounds.  Pulmonary:     Effort: Pulmonary effort is normal.     Breath sounds: Normal breath sounds.  Abdominal:     General: Bowel sounds are normal.     Palpations: Abdomen is soft.  Musculoskeletal: Normal range of motion.  Lymphadenopathy:     Head:     Right side of head: No submental or submandibular adenopathy.     Left side of head: No submental or submandibular adenopathy.     Cervical: No cervical adenopathy.  Neurological:     Mental Status: She is alert and oriented to person, place, and time.    A: 1. Conjunctivitis of right eye, unspecified conjunctivitis type    P: Discussed exam findings, diagnosis etiology and medication use and indications reviewed with patient. Follow- Up and discharge instructions provided. No emergent/urgent issues found on exam.  Patient verbalized understanding of information provided and agrees with plan of care (POC), all questions answered.  Discussed potential for viral or allergic causes of this due to known seasonal environmental influences. Patient requesting abx and agrees to take oral antihistamine.  1. Conjunctivitis of right eye, unspecified conjunctivitis type - ciprofloxacin (CILOXAN) 0.3 % ophthalmic solution; Place 1 drop into the right eye every 4 (four) hours while awake for 5 days. Administer 1 drop, every  2 hours, while awake, for 2 days. Then 1 drop, every 4 hours, while awake, for the next 5 days.

## 2018-01-31 NOTE — Patient Instructions (Signed)

## 2018-02-04 ENCOUNTER — Encounter: Payer: Self-pay | Admitting: Physician Assistant

## 2018-02-04 ENCOUNTER — Ambulatory Visit: Payer: Self-pay | Admitting: Physician Assistant

## 2018-02-04 VITALS — BP 105/65 | HR 88 | Temp 98.1°F | Resp 18 | Wt 145.0 lb

## 2018-02-04 DIAGNOSIS — H6993 Unspecified Eustachian tube disorder, bilateral: Secondary | ICD-10-CM

## 2018-02-04 DIAGNOSIS — J029 Acute pharyngitis, unspecified: Secondary | ICD-10-CM

## 2018-02-04 DIAGNOSIS — H6983 Other specified disorders of Eustachian tube, bilateral: Secondary | ICD-10-CM

## 2018-02-04 DIAGNOSIS — R6889 Other general symptoms and signs: Secondary | ICD-10-CM

## 2018-02-04 DIAGNOSIS — J069 Acute upper respiratory infection, unspecified: Secondary | ICD-10-CM

## 2018-02-04 LAB — POCT INFLUENZA A/B
INFLUENZA A, POC: NEGATIVE
Influenza B, POC: NEGATIVE

## 2018-02-04 LAB — POCT RAPID STREP A (OFFICE): Rapid Strep A Screen: NEGATIVE

## 2018-02-04 MED ORDER — CETIRIZINE HCL 10 MG PO TABS: 10.0000 mg | ORAL_TABLET | Freq: Every day | ORAL | 0 refills | Status: DC

## 2018-02-04 MED ORDER — FLUTICASONE PROPIONATE 50 MCG/ACT NA SUSP: 2.0000 | Freq: Every day | NASAL | 0 refills | Status: DC

## 2018-02-04 MED ORDER — PSEUDOEPHEDRINE HCL 60 MG PO TABS: 60.0000 mg | ORAL_TABLET | Freq: Three times a day (TID) | ORAL | 0 refills | Status: DC | PRN

## 2018-02-04 MED ORDER — PROMETHAZINE-DM 6.25-15 MG/5ML PO SYRP: 5.0000 mL | ORAL_SOLUTION | Freq: Four times a day (QID) | ORAL | 0 refills | Status: DC | PRN

## 2018-02-04 NOTE — Patient Instructions (Signed)
Thank you for choosing [facility] for your health care needs.   - We will treat this as a viral upper respiratory infection with eustachian tube dysfunction.  I recommend you rest, drink plenty of fluids, eat light meals, including soups, and use supportive measures discussed below.   Runny nose and nasal congestion - Start flonase nasal spray daily along with oral sudafed.  Saline nasal sprays can also be helpful to relieve runny nose and congestion, especially if you do this before bed.   Sore throat and headache - Sore throat and headache are best treated with a mild pain reliever such as over the counter Tylenol, ibuprofen, Motrin or Aleve. You may also use throat lozenges. Tea recipe for sore throat: boil water, add 2 inches shaved ginger root, steep 15 minutes, add juice from 2 full lemons, and 2 tbsp honey.  Cough - For night time cough, take prescribed cough syrup.  Be aware that this can make you drowsy and sleepy so do not drive or operate any heavy machinery if it is affecting you during the day.   May use humidifier or vaporizer in bedroom. Use several pillows to prop self up at night, may help with coughing and sleeping.  -Some homemade cough syrups can be just as effective as the over-the-counter medications with much fewer side effects. Also they can be combined with over-the-counter and prescription cough regimens, to make them even more effective together. Sweet lemon, honey, and thyme cough syrup recipe: Place half of a chopped lemon in a container and cover with half a cup of honey (raw is best but any will do). Then boil a handful of fresh thyme sprigs or organic dry leaves with 2 cups of water uncovered until it is reduced to one cup. Strain this liquid into the jar with the lemon and honey to remove the steams and leaves and then shake it up. Use 1 teaspoonful as often as needed and it will store in the refrigerator for at least a month.     Return if no improvement in 5-7 days  or sooner if symptoms worsen.     Upper Respiratory Infection, Adult An upper respiratory infection (URI) affects the nose, throat, and upper air passages. URIs are caused by germs (viruses). The most common type of URI is often called "the common cold." Medicines cannot cure URIs, but you can do things at home to relieve your symptoms. URIs usually get better within 7-10 days. Follow these instructions at home: Activity  Rest as needed.  If you have a fever, stay home from work or school until your fever is gone, or until your doctor says you may return to work or school. ? You should stay home until you cannot spread the infection anymore (you are not contagious). ? Your doctor may have you wear a face mask so you have less risk of spreading the infection. Relieving symptoms  Gargle with a salt-water mixture 3-4 times a day or as needed. To make a salt-water mixture, completely dissolve -1 tsp of salt in 1 cup of warm water.  Use a cool-mist humidifier to add moisture to the air. This can help you breathe more easily. Eating and drinking   Drink enough fluid to keep your pee (urine) pale yellow.  Eat soups and other clear broths. General instructions   Take over-the-counter and prescription medicines only as told by your doctor. These include cold medicines, fever reducers, and cough suppressants.  Do not use any products that contain  nicotine or tobacco. These include cigarettes and e-cigarettes. If you need help quitting, ask your doctor.  Avoid being where people are smoking (avoid secondhand smoke).  Make sure you get regular shots and get the flu shot every year.  Keep all follow-up visits as told by your doctor. This is important. How to avoid spreading infection to others   Wash your hands often with soap and water. If you do not have soap and water, use hand sanitizer.  Avoid touching your mouth, face, eyes, or nose.  Cough or sneeze into a tissue or your  sleeve or elbow. Do not cough or sneeze into your hand or into the air. Contact a doctor if:  You are getting worse, not better.  You have any of these: ? A fever. ? Chills. ? Brown or red mucus in your nose. ? Yellow or brown fluid (discharge)coming from your nose. ? Pain in your face, especially when you bend forward. ? Swollen neck glands. ? Pain with swallowing. ? White areas in the back of your throat. Get help right away if:  You have shortness of breath that gets worse.  You have very bad or constant: ? Headache. ? Ear pain. ? Pain in your forehead, behind your eyes, and over your cheekbones (sinus pain). ? Chest pain.  You have long-lasting (chronic) lung disease along with any of these: ? Wheezing. ? Long-lasting cough. ? Coughing up blood. ? A change in your usual mucus.  You have a stiff neck.  You have changes in your: ? Vision. ? Hearing. ? Thinking. ? Mood. Summary  An upper respiratory infection (URI) is caused by a germ called a virus. The most common type of URI is often called "the common cold."  URIs usually get better within 7-10 days.  Take over-the-counter and prescription medicines only as told by your doctor. This information is not intended to replace advice given to you by your health care provider. Make sure you discuss any questions you have with your health care provider. Document Released: 07/22/2007 Document Revised: 09/25/2016 Document Reviewed: 09/25/2016 Elsevier Interactive Patient Education  2019 [employer].       Eustachian Tube Dysfunction  Eustachian tube dysfunction refers to a condition in which a blockage develops in the narrow passage that connects the middle ear to the back of the nose (eustachian tube). The eustachian tube regulates air pressure in the middle ear by letting air move between the ear and nose. It also helps to drain fluid from the middle ear space. Eustachian tube dysfunction can affect one or both  ears. When the eustachian tube does not function properly, air pressure, fluid, or both can build up in the middle ear. What are the causes? This condition occurs when the eustachian tube becomes blocked or cannot open normally. Common causes of this condition include:  Ear infections.  Colds and other infections that affect the nose, mouth, and throat (upper respiratory tract).  Allergies.  Irritation from cigarette smoke.  Irritation from stomach acid coming up into the esophagus (gastroesophageal reflux). The esophagus is the tube that carries food from the mouth to the stomach.  Sudden changes in air pressure, such as from descending in an airplane or scuba diving.  Abnormal growths in the nose or throat, such as: ? Growths that line the nose (nasal polyps). ? Abnormal growth of cells (tumors). ? Enlarged tissue at the back of the throat (adenoids). What increases the risk? You are more likely to develop this condition  if:  You smoke.  You are overweight.  You are a child who has: ? Certain birth defects of the mouth, such as cleft palate. ? Large tonsils or adenoids. What are the signs or symptoms? Common symptoms of this condition include:  A feeling of fullness in the ear.  Ear pain.  Clicking or popping noises in the ear.  Ringing in the ear.  Hearing loss.  Loss of balance.  Dizziness. Symptoms may get worse when the air pressure around you changes, such as when you travel to an area of high elevation, fly on an airplane, or go scuba diving. How is this diagnosed? This condition may be diagnosed based on:  Your symptoms.  A physical exam of your ears, nose, and throat.  Tests, such as those that measure: ? The movement of your eardrum (tympanogram). ? Your hearing (audiometry). How is this treated? Treatment depends on the cause and severity of your condition.  In mild cases, you may relieve your symptoms by moving air into your ears. This is  called "popping the ears."  In more severe cases, or if you have symptoms of fluid in your ears, treatment may include: ? Medicines to relieve congestion (decongestants). ? Medicines that treat allergies (antihistamines). ? Nasal sprays or ear drops that contain medicines that reduce swelling (steroids). ? A procedure to drain the fluid in your eardrum (myringotomy). In this procedure, a small tube is placed in the eardrum to:  Drain the fluid.  Restore the air in the middle ear space. ? A procedure to insert a balloon device through the nose to inflate the opening of the eustachian tube (balloon dilation). Follow these instructions at home: Lifestyle  Do not do any of the following until your health care provider approves: ? Travel to high altitudes. ? Fly in airplanes. ? Work in a P[employer] scheme manager or room. ? Scuba dive.  Do not use any products that contain nicotine or tobacco, such as cigarettes and e-cigarettes. If you need help quitting, ask your health care provider.  Keep your ears dry. Wear fitted earplugs during showering and bathing. Dry your ears completely after. General instructions  Take over-the-counter and prescription medicines only as told by your health care provider.  Use techniques to help pop your ears as recommended by your health care provider. These may include: ? Chewing gum. ? Yawning. ? Frequent, forceful swallowing. ? Closing your mouth, holding your nose closed, and gently blowing as if you are trying to blow air out of your nose.  Keep all follow-up visits as told by your health care provider. This is important. Contact a health care provider if:  Your symptoms do not go away after treatment.  Your symptoms come back after treatment.  You are unable to pop your ears.  You have: ? A fever. ? Pain in your ear. ? Pain in your head or neck. ? Fluid draining from your ear.  Your hearing suddenly changes.  You become very dizzy.  You lose  your balance. Summary  Eustachian tube dysfunction refers to a condition in which a blockage develops in the eustachian tube.  It can be caused by ear infections, allergies, inhaled irritants, or abnormal growths in the nose or throat.  Symptoms include ear pain, hearing loss, or ringing in the ears.  Mild cases are treated with maneuvers to unblock the ears, such as yawning or ear popping.  Severe cases are treated with medicines. Surgery may also be done (rare). This information is  not intended to replace advice given to you by your health care provider. Make sure you discuss any questions you have with your health care provider. Document Released: 03/01/2015 Document Revised: 05/25/2017 Document Reviewed: 05/25/2017 Elsevier Interactive Patient Education  2019 [employer].

## 2018-02-04 NOTE — Progress Notes (Signed)
MRN: [id number] DOB: August 07, 1960  Subjective:   Lorraine Owen is a 57 y.o. female presenting for chief complaint of Sore Throat (X 4 DAYS); Headache (X 4 DAYS); Ear Pain (X 4 DAYS, BOTH EARS); Chills (X 4 DAYS); Generalized Body Aches (X 4 DAYS); and Fever .  Reports 4 day history of b/l ear fullness/pain, PND, sore throat, head congestion, nasal congestion, body aches, and chills. Having dry cough at night time. Has subjective fever. Most concerned about ear pain. Has not tried anything for releif because she does not know what to try. Sick exposure to daughter, who is better now. Denies sinus pain, wheezing, SOB,  itchy watery eyes, ear drainage and chest pain, nausea, vomiting, abdominal pain and diarrhea. No PMH of seasonal allergies or asthma. Denies smoking. Of note, pt was seen here on 01/31/18. Dx with conjunctivitis. It was suspected to be viral vs allergic but pt requested abx eye drops. She was given Rx for ciloxan eye drops. Denies any other aggravating or relieving factors, no other questions or concerns.  Review of Systems  Eyes: Negative for blurred vision, double vision and photophobia.  Musculoskeletal: Negative for neck pain.  Skin: Negative for rash.  Neurological: Negative for dizziness, sensory change, speech change and weakness.     Shannel has a current medication list which includes the following prescription(s): acetaminophen, ciprofloxacin, ibuprofen, imipramine, probiotic product, sumatriptan, vitamin b-12, cetirizine, fluticasone, promethazine-dextromethorphan, and pseudoephedrine. Also is allergic to sulfa antibiotics and penicillins.  [name]  has a past medical history of DDD (degenerative disc disease), cervical, Distal radial fracture, Migraine, Periodic limb movement disorder (PLMD), and Snoring. Also  has a past surgical history that includes none; Wisdom tooth extraction; and Open reduction internal fixation (orif) distal radial fracture (Left, 12/17/2015).    Objective:   Vitals: BP 105/65 (BP Location: Right Arm, Patient Position: Sitting, Cuff Size: Normal)   Pulse 88   Temp 98.1 F (36.7 C) (Oral)   Resp 18   Wt 145 lb (65.8 kg)   LMP 11/16/2014   SpO2 98%   BMI 23.76 kg/m   Physical Exam Vitals signs reviewed.  Constitutional:      General: She is not in acute distress.    Appearance: She is well-developed.  HENT:     Head: Normocephalic and atraumatic.     Right Ear: Hearing, ear canal and external ear normal. No drainage or tenderness. A middle ear effusion is present. No mastoid tenderness. Tympanic membrane is retracted. Tympanic membrane is not perforated, erythematous or bulging.     Left Ear: Hearing, ear canal and external ear normal. No drainage or tenderness. A middle ear effusion is present. No mastoid tenderness. Tympanic membrane is retracted. Tympanic membrane is not perforated, erythematous or bulging.     Nose: Mucosal edema and congestion present.     Right Sinus: No maxillary sinus tenderness or frontal sinus tenderness.     Left Sinus: No maxillary sinus tenderness or frontal sinus tenderness.     Mouth/Throat:     Pharynx: Uvula midline. Posterior oropharyngeal erythema present.     Tonsils: No tonsillar exudate or tonsillar abscesses. Swelling: 1+ on the right. 1+ on the left.  Eyes:     Conjunctiva/sclera: Conjunctivae normal.  Neck:     Musculoskeletal: Normal range of motion.  Cardiovascular:     Rate and Rhythm: Normal rate and regular rhythm.     Heart sounds: Normal heart sounds.  Pulmonary:     Effort: Pulmonary effort is normal.  Breath sounds: Normal breath sounds. No decreased breath sounds, wheezing, rhonchi or rales.  Lymphadenopathy:     Head:     Right side of head: No submental, submandibular, tonsillar, preauricular, posterior auricular or occipital adenopathy.     Left side of head: No submental, submandibular, tonsillar, preauricular, posterior auricular or occipital adenopathy.      Cervical: No cervical adenopathy.     Upper Body:     Right upper body: No supraclavicular adenopathy.     Left upper body: No supraclavicular adenopathy.  Skin:    General: Skin is warm and dry.  Neurological:     Mental Status: She is alert and oriented to person, place, and time.     Results for orders placed or performed in visit on 02/04/18 (from the past 24 hour(s))  POCT rapid strep A     Status: Normal   Collection Time: 02/04/18  9:27 AM  Result Value Ref Range   Rapid Strep A Screen Negative Negative  POCT Influenza A/B     Status: Normal   Collection Time: 02/04/18  9:39 AM  Result Value Ref Range   Influenza A, POC Negative Negative   Influenza B, POC Negative Negative    Assessment and Plan :  1. Viral upper respiratory tract infection -Pt is overall well appearing, NAD. Vitals stable. POCT neg for strep and flu. Likely viral URI with associated ETD.  - Advised supportive care, offered symptomatic relief. - Return to clinic if symptoms worsen or fail to improve in 5-7 days, otherwise return to clinic as needed. - pseudoephedrine (SUDAFED) 60 MG tablet; Take 1 tablet (60 mg total) by mouth every 8 (eight) hours as needed for congestion.  Dispense: 20 tablet; Refill: 0 - fluticasone (FLONASE) 50 MCG/ACT nasal spray; Place 2 sprays into both nostrils daily.  Dispense: 16 g; Refill: 0 - cetirizine (ZYRTEC) 10 MG tablet; Take 1 tablet (10 mg total) by mouth daily.  Dispense: 30 tablet; Refill: 0 - promethazine-dextromethorphan (PROMETHAZINE-DM) 6.25-15 MG/5ML syrup; Take 5 mLs by mouth 4 (four) times daily as needed for cough.  Dispense: 118 mL; Refill: 0  2. Dysfunction of both eustachian tubes 3. Sore throat - POCT rapid strep A  4. Flu-like symptoms - POCT Influenza A/B    Tenna Delaine, PA-C  [facility] 02/04/2018 9:40 AM

## 2018-03-24 DIAGNOSIS — Z124 Encounter for screening for malignant neoplasm of cervix: Secondary | ICD-10-CM | POA: Diagnosis not present

## 2018-03-24 DIAGNOSIS — Z1231 Encounter for screening mammogram for malignant neoplasm of breast: Secondary | ICD-10-CM | POA: Diagnosis not present

## 2018-03-24 DIAGNOSIS — Z01419 Encounter for gynecological examination (general) (routine) without abnormal findings: Secondary | ICD-10-CM | POA: Diagnosis not present

## 2018-03-24 DIAGNOSIS — Z6823 Body mass index (BMI) 23.0-23.9, adult: Secondary | ICD-10-CM | POA: Diagnosis not present

## 2018-07-01 ENCOUNTER — Encounter: Payer: Self-pay | Admitting: Family Medicine

## 2018-07-01 ENCOUNTER — Ambulatory Visit (INDEPENDENT_AMBULATORY_CARE_PROVIDER_SITE_OTHER): Payer: BLUE CROSS/BLUE SHIELD | Admitting: Family Medicine

## 2018-07-01 ENCOUNTER — Other Ambulatory Visit: Payer: Self-pay

## 2018-07-01 DIAGNOSIS — M5030 Other cervical disc degeneration, unspecified cervical region: Secondary | ICD-10-CM | POA: Diagnosis not present

## 2018-07-01 DIAGNOSIS — Z131 Encounter for screening for diabetes mellitus: Secondary | ICD-10-CM | POA: Diagnosis not present

## 2018-07-01 DIAGNOSIS — Z13220 Encounter for screening for lipoid disorders: Secondary | ICD-10-CM

## 2018-07-01 NOTE — Progress Notes (Signed)
Virtual Visit via Video Note  I connected with Lorraine Owen   on 07/01/18 at 10:45 AM EDT by a video enabled telemedicine application and verified that I am speaking with the correct person using two identifiers.  Location patient: home Location provider:work office Persons participating in the virtual visit: patient, provider  I discussed the limitations of evaluation and management by telemedicine and the availability of in person appointments. The patient expressed understanding and agreed to proceed.   Lorraine Owen DOB: 10/02/1960 Encounter date: 07/01/2018  This is a 58 y.o. female who presents to establish care. No chief complaint on file.   History of present illness: In pretty good health; just needs primary for acute issues as they arise.   Migraine: stopped after getting off of pill.   DDD: neck issues were worse when she was working out regularly and doing things that caused more irritation - jet skiing, water skiing. Stopped diving as well.   Has had 2 breast lumps removed. Benign. Gets regular mammogram. Has not ever been diagnosed with breast cancer. Uncertain why this was documented in her chart previously (note it was Dr. Donne Hazel that this was entered under (order only) and patient does not remember seeing this provider. It appears that this may have been a surgical order done during time of breast nodule removal. No other notes in chart or pathology report available).   Follows at [facility] regularly.   Has great energy level. States that it is always really high. Has difficulty falling asleep. Sleeps only 4 hours/night. Just functions well on less sleep.   Past Medical History:  Diagnosis Date  . DDD (degenerative disc disease), cervical   . Distal radial fracture    left  . Migraine   . Periodic limb movement disorder (PLMD)   . Snoring    Past Surgical History:  Procedure Laterality Date  . none    . OPEN REDUCTION INTERNAL FIXATION  (ORIF) DISTAL RADIAL FRACTURE Left 12/17/2015   Procedure: OPEN TREATMENT OF LEFT DISTAL RADIUS FRACTURE;  Surgeon: Milly Jakob, MD;  Location: [facility];  Service: Orthopedics;  Laterality: Left;  GENERAL ANESTHESIA WITH PRE-OP BLOCK  . TOE SURGERY Left 2017  . WISDOM TOOTH EXTRACTION     Allergies  Allergen Reactions  . Sulfa Antibiotics   . Penicillins Rash   No outpatient medications have been marked as taking for the 07/01/18 encounter (Office Visit) with Caren Macadam, MD.   Social History   Tobacco Use  . Smoking status: Never Smoker  . Smokeless tobacco: Never Used  Substance Use Topics  . Alcohol use: No   Family History  Problem Relation Age of Onset  . Migraines Mother   . Thyroid disease Mother   . Skin cancer Father   . Prostate cancer Father 85  . Kidney failure Father 18       after radiation for cancer; dialysis x 4 years  . Healthy Father   . Obesity Sister   . Obesity Brother   . Leukemia Maternal Grandmother   . Bone cancer Paternal Grandmother   . Obesity Sister   . Obesity Sister   . Obesity Brother   . Obesity Sister      Review of Systems  Constitutional: Negative for chills, fatigue and fever.  Respiratory: Negative for cough, chest tightness, shortness of breath and wheezing.   Cardiovascular: Negative for chest pain, palpitations and leg swelling.    Objective:  LMP 11/16/2014  BP Readings from Last 3 Encounters:  02/04/18 105/65  01/31/18 100/70  12/17/15 116/74   Wt Readings from Last 3 Encounters:  02/04/18 145 lb (65.8 kg)  01/31/18 144 lb 9.6 oz (65.6 kg)  12/17/15 154 lb (69.9 kg)    EXAM:  GENERAL: alert, oriented, appears well and in no acute distress  HEENT: atraumatic, conjunctiva clear, no obvious abnormalities on inspection of external nose and ears  NECK: normal movements of the head and neck  LUNGS: on inspection no signs of respiratory distress, breathing rate appears normal, no  obvious gross SOB, gasping or wheezing  CV: no obvious cyanosis  MS: moves all visible extremities without noticeable abnormality  PSYCH/NEURO: pleasant and cooperative, no obvious depression or anxiety, speech and thought processing grossly intact  SKIN:no facial skin or neck abnormalities.   Assessment/Plan  1. Lipid screening - Lipid panel; Future  2. Screening for diabetes mellitus - Comprehensive metabolic panel; Future  3. DDD (degenerative disc disease), cervical Stable; cares for neck and avoids irritating activities when possible.  Return will schedule bloodwork in near future. She will get previous PCP record release signed when she returns for bloodwork. Additionally we will try to get records from breast nodule removal. Order during surgery coded as malignancy but per patient this was benign and she has had regular mammograms in follow up since that time (2015)   I discussed the assessment and treatment plan with the patient. The patient was provided an opportunity to ask questions and all were answered. The patient agreed with the plan and demonstrated an understanding of the instructions.   The patient was advised to call back or seek an in-person evaluation if the symptoms worsen or if the condition fails to improve as anticipated.  I provided 27 minutes of non-face-to-face time during this encounter.   Micheline Rough, MD

## 2018-09-27 DIAGNOSIS — Z23 Encounter for immunization: Secondary | ICD-10-CM | POA: Diagnosis not present

## 2018-09-27 DIAGNOSIS — S61412A Laceration without foreign body of left hand, initial encounter: Secondary | ICD-10-CM | POA: Diagnosis not present

## 2018-11-22 DIAGNOSIS — Z1159 Encounter for screening for other viral diseases: Secondary | ICD-10-CM | POA: Diagnosis not present

## 2018-11-25 DIAGNOSIS — K5730 Diverticulosis of large intestine without perforation or abscess without bleeding: Secondary | ICD-10-CM | POA: Diagnosis not present

## 2018-11-25 DIAGNOSIS — Z1211 Encounter for screening for malignant neoplasm of colon: Secondary | ICD-10-CM | POA: Diagnosis not present

## 2018-12-18 DIAGNOSIS — Z20828 Contact with and (suspected) exposure to other viral communicable diseases: Secondary | ICD-10-CM | POA: Diagnosis not present

## 2019-04-10 DIAGNOSIS — Z6824 Body mass index (BMI) 24.0-24.9, adult: Secondary | ICD-10-CM | POA: Diagnosis not present

## 2019-04-10 DIAGNOSIS — Z01419 Encounter for gynecological examination (general) (routine) without abnormal findings: Secondary | ICD-10-CM | POA: Diagnosis not present

## 2019-04-10 DIAGNOSIS — Z124 Encounter for screening for malignant neoplasm of cervix: Secondary | ICD-10-CM | POA: Diagnosis not present

## 2019-04-10 DIAGNOSIS — Z1231 Encounter for screening mammogram for malignant neoplasm of breast: Secondary | ICD-10-CM | POA: Diagnosis not present

## 2019-05-10 DIAGNOSIS — R05 Cough: Secondary | ICD-10-CM | POA: Diagnosis not present

## 2019-05-10 DIAGNOSIS — J029 Acute pharyngitis, unspecified: Secondary | ICD-10-CM | POA: Diagnosis not present

## 2019-05-10 DIAGNOSIS — Z20822 Contact with and (suspected) exposure to covid-19: Secondary | ICD-10-CM | POA: Diagnosis not present

## 2019-05-11 ENCOUNTER — Telehealth (INDEPENDENT_AMBULATORY_CARE_PROVIDER_SITE_OTHER): Payer: Self-pay | Admitting: Family Medicine

## 2019-05-11 ENCOUNTER — Encounter: Payer: Self-pay | Admitting: Family Medicine

## 2019-05-11 VITALS — Ht 65.5 in

## 2019-05-11 DIAGNOSIS — R0600 Dyspnea, unspecified: Secondary | ICD-10-CM

## 2019-05-11 DIAGNOSIS — R05 Cough: Secondary | ICD-10-CM

## 2019-05-11 DIAGNOSIS — R059 Cough, unspecified: Secondary | ICD-10-CM

## 2019-05-11 DIAGNOSIS — J029 Acute pharyngitis, unspecified: Secondary | ICD-10-CM

## 2019-05-11 NOTE — Progress Notes (Signed)
Virtual Visit via Video Note  I connected with Lorraine Owen  on 05/11/19 at  4:00 PM EDT by a video enabled telemedicine application and verified that I am speaking with the correct person using two identifiers.  Location patient: home, [place] Location provider:work or home office Persons participating in the virtual visit: patient, provider  I discussed the limitations of evaluation and management by telemedicine and the availability of in person appointments. The patient expressed understanding and agreed to proceed.   HPI:  Acute visit for a sore throat: -started 3-4 days ago -symptoms include: sore throat, cough, drainage in throat, she saw a white spot on one of her tonsils - reports symptoms ar emild -she thought it was allergies, but wanted to make sure as does not want to spread anything at work -Denies SOB, body aches, NVD, loss of taste or smell, fevers -denies any sick contact in the last 2 weeks -always uses masks and social distancing at work and if out of her home reports she is trying to be very cautious  ROS: See pertinent positives and negatives per HPI.  Past Medical History:  Diagnosis Date  . DDD (degenerative disc disease), cervical   . Distal radial fracture    left  . Migraine   . Periodic limb movement disorder (PLMD)   . Snoring     Past Surgical History:  Procedure Laterality Date  . breast     cyst removal  . OPEN REDUCTION INTERNAL FIXATION (ORIF) DISTAL RADIAL FRACTURE Left 12/17/2015   Procedure: OPEN TREATMENT OF LEFT DISTAL RADIUS FRACTURE;  Surgeon: Milly Jakob, MD;  Location: [facility];  Service: Orthopedics;  Laterality: Left;  GENERAL ANESTHESIA WITH PRE-OP BLOCK  . TOE SURGERY Left 2017  . WISDOM TOOTH EXTRACTION      Family History  Problem Relation Age of Onset  . Migraines Mother   . Thyroid disease Mother   . Skin cancer Father   . Prostate cancer Father 25  . Kidney failure Father 62       after radiation for cancer;  dialysis x 4 years  . Healthy Father   . Obesity Sister   . Obesity Brother   . Leukemia Maternal Grandmother   . Bone cancer Paternal Grandmother   . Obesity Sister   . Obesity Sister   . Obesity Brother   . Obesity Sister     SOCIAL HX: see hpi   Current Outpatient Medications:  .  acetaminophen (TYLENOL) 325 MG tablet, Take 650 mg by mouth every 6 (six) hours as needed., Disp: , Rfl:  .  imipramine (TOFRANIL) 10 MG tablet, Take 20 mg by mouth at bedtime. , Disp: , Rfl:  .  Probiotic Product (PROBIOTIC PO), Take 1 tablet by mouth daily., Disp: , Rfl:  .  vitamin B-12 (CYANOCOBALAMIN) 1000 MCG tablet, Take 1,000 mcg by mouth daily., Disp: , Rfl:   EXAM:  VITALS per patient if applicable:  GENERAL: alert, oriented, appears well and in no acute distress  HEENT: atraumatic, conjunttiva clear, no obvious abnormalities on inspection of external nose and ears, normal video visit  inspection of the oropharynx without appreciable tonsillar exudate or edema  NECK: normal movements of the head and neck  LUNGS: on inspection no signs of respiratory distress, breathing rate appears normal, no obvious gross SOB, gasping or wheezing  CV: no obvious cyanosis  MS: moves all visible extremities without noticeable abnormality  PSYCH/NEURO: pleasant and cooperative, no obvious depression or anxiety, speech and thought  processing grossly intact  ASSESSMENT AND PLAN:  Discussed the following assessment and plan:  Sore throat  PND (paroxysmal nocturnal dyspnea)  Cough  -we discussed possible serious and likely etiologies, options for evaluation and workup, limitations of telemedicine visit vs in person visit, treatment, treatment risks and precautions. Pt prefers to treat via telemedicine empirically rather then risking or undertaking an in person visit at this moment. Suspect Allergies or a mild VURI. COVID19 of course is still a possibility as well - though congratulated her on  precations and that does decrease her chances of that. She has opted for antihistamine, salt water gargles and COVID testing. Strep is probably unlikely given symptoms, exam and precautions she has been taking. Discussed options and limitations. [facility] plans [facility]. Agrees to self isolation for now.  Patient agrees to seek prompt in person care if worsening, new symptoms arise, or if is not improving with treatment.   I discussed the assessment and treatment plan with the patient. The patient was provided an opportunity to ask questions and all were answered. The patient agreed with the plan and demonstrated an understanding of the instructions.   The patient was advised to call back or seek an in-person evaluation if the symptoms worsen or if the condition fails to improve as anticipated.   Lorraine Kern, DO

## 2019-05-12 ENCOUNTER — Ambulatory Visit: Payer: BC Managed Care – PPO | Attending: Internal Medicine

## 2019-05-12 DIAGNOSIS — Z20822 Contact with and (suspected) exposure to covid-19: Secondary | ICD-10-CM | POA: Diagnosis not present

## 2019-05-13 LAB — NOVEL CORONAVIRUS, NAA: SARS-CoV-2, NAA: NOT DETECTED

## 2019-05-13 LAB — SARS-COV-2, NAA 2 DAY TAT

## 2019-09-06 ENCOUNTER — Ambulatory Visit: Payer: BC Managed Care – PPO | Attending: Internal Medicine

## 2019-09-06 DIAGNOSIS — Z20822 Contact with and (suspected) exposure to covid-19: Secondary | ICD-10-CM | POA: Insufficient documentation

## 2019-09-07 ENCOUNTER — Encounter: Payer: Self-pay | Admitting: Family Medicine

## 2019-09-07 ENCOUNTER — Telehealth (INDEPENDENT_AMBULATORY_CARE_PROVIDER_SITE_OTHER): Payer: Self-pay | Admitting: Family Medicine

## 2019-09-07 DIAGNOSIS — R05 Cough: Secondary | ICD-10-CM

## 2019-09-07 DIAGNOSIS — R059 Cough, unspecified: Secondary | ICD-10-CM

## 2019-09-07 DIAGNOSIS — Z7185 Encounter for immunization safety counseling: Secondary | ICD-10-CM

## 2019-09-07 DIAGNOSIS — Z7189 Other specified counseling: Secondary | ICD-10-CM

## 2019-09-07 DIAGNOSIS — R0982 Postnasal drip: Secondary | ICD-10-CM

## 2019-09-07 LAB — SARS-COV-2, NAA 2 DAY TAT

## 2019-09-07 LAB — NOVEL CORONAVIRUS, NAA: SARS-CoV-2, NAA: NOT DETECTED

## 2019-09-07 MED ORDER — FLOVENT HFA 44 MCG/ACT IN AERO: 2.0000 | INHALATION_SPRAY | Freq: Two times a day (BID) | RESPIRATORY_TRACT | 0 refills | Status: DC

## 2019-09-07 NOTE — Patient Instructions (Addendum)
-  use the flovent inhaler per instructions for 2 weeks - sent to the pharmacy  -start the allegra (available over the counter) one tablet daily for 2 weeks  -if not improving over the next 1 weeks start over the counter esomeprazole and take once daily for 1 week to see if this helps  -follow up with your doctor in 2 weeks, sooner if worsening, new symptoms arise or if you are not improving with treatment   COVID19 VACCINE INFORMATION:  [email or web address]

## 2019-09-07 NOTE — Progress Notes (Signed)
Virtual Visit via Video Note  I connected with Lorraine Owen  on 09/07/19 at  1:20 PM EDT by a video enabled telemedicine application and verified that I am speaking with the correct person using two identifiers.  Location patient: home, [place] Location provider:work or home office Persons participating in the virtual visit: patient, provider  I discussed the limitations of evaluation and management by telemedicine and the availability of in person appointments. The patient expressed understanding and agreed to proceed.   HPI:  Acute visit for a cough: -started about 1 month ago -she did a covid test yesterday and it was negative -symptoms include: cough, PND, tickle/sore throat, when takes a deep breath feels like has congestion in the chest - "feels a little different" -she has had this before and thinks it may be bronchitis from allergies -denies fevers, malaise, GI symptoms, SOB, chest pain -denies history of respiratory disease -not vaccinated for COVID19, she was avoiding because she was so busy and didn't want to be out of work from side effects, wasn't sure where to get it now - history of GERD, but denies symptoms currently  ROS: See pertinent positives and negatives per HPI.  Past Medical History:  Diagnosis Date  . DDD (degenerative disc disease), cervical   . Distal radial fracture    left  . Migraine   . Periodic limb movement disorder (PLMD)   . Snoring     Past Surgical History:  Procedure Laterality Date  . breast     cyst removal  . OPEN REDUCTION INTERNAL FIXATION (ORIF) DISTAL RADIAL FRACTURE Left 12/17/2015   Procedure: OPEN TREATMENT OF LEFT DISTAL RADIUS FRACTURE;  Surgeon: Milly Jakob, MD;  Location: [facility];  Service: Orthopedics;  Laterality: Left;  GENERAL ANESTHESIA WITH PRE-OP BLOCK  . TOE SURGERY Left 2017  . WISDOM TOOTH EXTRACTION      Family History  Problem Relation Age of Onset  . Migraines Mother   . Thyroid disease Mother    . Skin cancer Father   . Prostate cancer Father 53  . Kidney failure Father 70       after radiation for cancer; dialysis x 4 years  . Healthy Father   . Obesity Sister   . Obesity Brother   . Leukemia Maternal Grandmother   . Bone cancer Paternal Grandmother   . Obesity Sister   . Obesity Sister   . Obesity Brother   . Obesity Sister     SOCIAL HX: see hpi   Current Outpatient Medications:  .  fluticasone (FLOVENT HFA) 44 MCG/ACT inhaler, Inhale 2 puffs into the lungs in the morning and at bedtime., Disp: 1 Inhaler, Rfl: 0  EXAM:  VITALS per patient if applicable:  GENERAL: alert, oriented, appears well and in no acute distress  HEENT: atraumatic, conjunttiva clear, no obvious abnormalities on inspection of external nose and ears  NECK: normal movements of the head and neck  LUNGS: on inspection no signs of respiratory distress, breathing rate appears normal, no obvious gross SOB, gasping or wheezing, no cough during visit  CV: no obvious cyanosis  MS: moves all visible extremities without noticeable abnormality  PSYCH/NEURO: pleasant and cooperative, no obvious depression or anxiety, speech and thought processing grossly intact  ASSESSMENT AND PLAN:  Discussed the following assessment and plan:  Cough  PND (post-nasal drip)  -we discussed possible serious and likely etiologies, options for evaluation and workup, limitations of telemedicine visit vs in person visit, treatment, treatment risks and precautions. Pt  prefers to treat via telemedicine empirically at the moment. Discussed causes of subacute and chronic cough. Query viral or allergic trigger with possible bronchitis vs silent reflux vs other. Opted for trial flovent inhaler (she prefers to avoid systemic steroids) and allegra for 1-2 weeks. Start otc PPI if not improving in 1 week. Follow up with PCP in 2 weeks advised, sooner  if worsening, new symptoms arise, or if is not improving with  treatment. Counseled on risks/benefits.common side effects of COVID19 vaccine per current data and in light of current delta cases rising. She is considering getting - gave her website for locations to get vaccine.   I discussed the assessment and treatment plan with the patient. The patient was provided an opportunity to ask questions and all were answered. The patient agreed with the plan and demonstrated an understanding of the instructions.   The patient was advised to call back or seek an in-person evaluation if the symptoms worsen or if the condition fails to improve as anticipated.   Lucretia Kern, DO

## 2019-09-08 ENCOUNTER — Telehealth: Payer: Self-pay | Admitting: Family Medicine

## 2019-09-08 NOTE — Telephone Encounter (Signed)
-----   Message from Lucretia Kern, DO sent at 09/07/2019  2:06 PM EDT ----- Needs follow up in 2 weeks. Thanks!

## 2019-09-08 NOTE — Telephone Encounter (Signed)
Pt was called and she stated that she will call the office back to r/s due to her driving.

## 2019-09-12 NOTE — Telephone Encounter (Signed)
Called pt and no answer trying to get her scheduled per Dr. Maudie Mercury

## 2020-05-09 ENCOUNTER — Telehealth (INDEPENDENT_AMBULATORY_CARE_PROVIDER_SITE_OTHER): Payer: BC Managed Care – PPO | Admitting: Family Medicine

## 2020-05-09 ENCOUNTER — Encounter: Payer: Self-pay | Admitting: Family Medicine

## 2020-05-09 DIAGNOSIS — R059 Cough, unspecified: Secondary | ICD-10-CM

## 2020-05-09 DIAGNOSIS — R0981 Nasal congestion: Secondary | ICD-10-CM | POA: Diagnosis not present

## 2020-05-09 MED ORDER — BENZONATATE 100 MG PO CAPS: 100.0000 mg | ORAL_CAPSULE | Freq: Three times a day (TID) | ORAL | 0 refills | Status: DC | PRN

## 2020-05-09 NOTE — Progress Notes (Signed)
Virtual Visit via Video Note  I connected with Lorraine Owen  on 05/09/20 at 11:20 AM EDT by a video enabled telemedicine application and verified that I am speaking with the correct person using two identifiers.  Location patient: home, [place] Location provider:work or home office Persons participating in the virtual visit: patient, provider  I discussed the limitations of evaluation and management by telemedicine and the availability of in person appointments. The patient expressed understanding and agreed to proceed.   HPI:  Acute telemedicine visit for cough and congestion: -Onset: 3 days ago -Symptoms include: sore throat, nasal congestion, ears stopped up, headache, cough -Denies: fevers, CP, SOB, body aches, NVD, inability to eat/drink/get out of bed -Has tried: ibuprofen, tylenol -Pertinent past medical history: see  -Pertinent medication allergies: pencillins, sulfa, sulpha -COVID-19 vaccine status: 2 doses of covid vaccine  ROS: See pertinent positives and negatives per HPI.  Past Medical History:  Diagnosis Date  . DDD (degenerative disc disease), cervical   . Distal radial fracture    left  . Migraine   . Periodic limb movement disorder (PLMD)   . Snoring     Past Surgical History:  Procedure Laterality Date  . breast     cyst removal  . OPEN REDUCTION INTERNAL FIXATION (ORIF) DISTAL RADIAL FRACTURE Left 12/17/2015   Procedure: OPEN TREATMENT OF LEFT DISTAL RADIUS FRACTURE;  Surgeon: Milly Jakob, MD;  Location: [facility];  Service: Orthopedics;  Laterality: Left;  GENERAL ANESTHESIA WITH PRE-OP BLOCK  . TOE SURGERY Left 2017  . WISDOM TOOTH EXTRACTION       Current Outpatient Medications:  .  benzonatate (TESSALON PERLES) 100 MG capsule, Take 1 capsule (100 mg total) by mouth 3 (three) times daily as needed., Disp: 20 capsule, Rfl: 0 .  fluticasone (FLOVENT HFA) 44 MCG/ACT inhaler, Inhale 2 puffs into the lungs in the morning and at bedtime., Disp: 1  Inhaler, Rfl: 0  EXAM:  VITALS per patient if applicable:  GENERAL: alert, oriented, appears well and in no acute distress  HEENT: atraumatic, conjunttiva clear, no obvious abnormalities on inspection of external nose and ears  NECK: normal movements of the head and neck  LUNGS: on inspection no signs of respiratory distress, breathing rate appears normal, no obvious gross SOB, gasping or wheezing  CV: no obvious cyanosis  MS: moves all visible extremities without noticeable abnormality  PSYCH/NEURO: pleasant and cooperative, no obvious depression or anxiety, speech and thought processing grossly intact  ASSESSMENT AND PLAN:  Discussed the following assessment and plan:  Cough  Nasal congestion  -we discussed possible serious and likely etiologies, options for evaluation and workup, limitations of telemedicine visit vs in person visit, treatment, treatment risks and precautions. Pt prefers to treat via telemedicine empirically rather than in person at this moment.  Query VURI, AR, Covid19 vs other. Also discussed possibility of influenza. She opted for rx for Tessalon for cough, short course nasal decongestant and other care measures per patient instructions. Discuss covid testing options. Work/School slipped offered:  declined Scheduled follow up with PCP offered: agrees to schedule follow up if needed. Advised to seek prompt in person care if worsening, new symptoms arise, or if is not improving with treatment.   I discussed the assessment and treatment plan with the patient. The patient was provided an opportunity to ask questions and all were answered. The patient agreed with the plan and demonstrated an understanding of the instructions.     Lucretia Kern, DO

## 2020-05-09 NOTE — Patient Instructions (Signed)
  HOME CARE TIPS:  -[place] testing information: [email or web address] OR [phone] Most pharmacies also offer testing and home test kits.  -I sent the medication(s) we discussed to your pharmacy: Meds ordered this encounter  Medications  . benzonatate (TESSALON PERLES) 100 MG capsule    Sig: Take 1 capsule (100 mg total) by mouth 3 (three) times daily as needed.    Dispense:  20 capsule    Refill:  0    -can use tylenol or aleve if needed for fevers, aches and pains per instructions  -can use nasal saline a few times per day if you have nasal congestion; sometimes  a short course of Afrin nasal spray for 3 days can help with symptoms as well  -stay hydrated, drink plenty of fluids and eat small healthy meals - avoid dairy  -can take 1000 IU (16mcg) Vit D3 and 100-500 mg of Vit C daily per instructions  -follow up with your doctor in 2-3 days unless improving and feeling better  -stay home while sick, except to seek medical care  It was nice to meet you today, and I really hope you are feeling better soon. I help [facility] out with telemedicine visits on Tuesdays and Thursdays and am available for visits on those days. If you have any concerns or questions following this visit please schedule a follow up visit with your Primary Care doctor or seek care at a local urgent care clinic to avoid delays in care.    Seek in person care or schedule a follow up video visit promptly if your symptoms worsen, new concerns arise or you are not improving with treatment. Call 911 and/or seek emergency care if your symptoms are severe or life threatening.

## 2020-05-12 ENCOUNTER — Encounter: Payer: Self-pay | Admitting: Family Medicine

## 2020-05-13 MED ORDER — SUMATRIPTAN SUCCINATE 50 MG PO TABS: 50.0000 mg | ORAL_TABLET | Freq: Every day | ORAL | 2 refills | Status: DC | PRN

## 2020-05-16 ENCOUNTER — Encounter: Payer: Self-pay | Admitting: Family Medicine

## 2020-07-05 DIAGNOSIS — Z1231 Encounter for screening mammogram for malignant neoplasm of breast: Secondary | ICD-10-CM | POA: Diagnosis not present

## 2020-07-05 DIAGNOSIS — Z01419 Encounter for gynecological examination (general) (routine) without abnormal findings: Secondary | ICD-10-CM | POA: Diagnosis not present

## 2020-07-05 DIAGNOSIS — Z6822 Body mass index (BMI) 22.0-22.9, adult: Secondary | ICD-10-CM | POA: Diagnosis not present

## 2020-07-05 DIAGNOSIS — Z124 Encounter for screening for malignant neoplasm of cervix: Secondary | ICD-10-CM | POA: Diagnosis not present

## 2020-08-05 ENCOUNTER — Other Ambulatory Visit: Payer: Self-pay

## 2020-08-06 ENCOUNTER — Encounter: Payer: Self-pay | Admitting: Family Medicine

## 2020-08-06 ENCOUNTER — Ambulatory Visit (INDEPENDENT_AMBULATORY_CARE_PROVIDER_SITE_OTHER): Payer: BC Managed Care – PPO | Admitting: Family Medicine

## 2020-08-06 VITALS — BP 128/74 | HR 83 | Temp 98.0°F | Wt 136.9 lb

## 2020-08-06 DIAGNOSIS — W57XXXA Bitten or stung by nonvenomous insect and other nonvenomous arthropods, initial encounter: Secondary | ICD-10-CM

## 2020-08-06 DIAGNOSIS — S30860A Insect bite (nonvenomous) of lower back and pelvis, initial encounter: Secondary | ICD-10-CM | POA: Diagnosis not present

## 2020-08-06 MED ORDER — DOXYCYCLINE HYCLATE 100 MG PO CAPS: 100.0000 mg | ORAL_CAPSULE | Freq: Two times a day (BID) | ORAL | 0 refills | Status: DC

## 2020-08-06 NOTE — Progress Notes (Signed)
Established Patient Office Visit  Subjective:  Patient ID: Lorraine Owen, female    DOB: 02-Oct-1960  Age: 60 y.o. MRN: [id number]  CC:  Chief Complaint  Patient presents with   Rash    Tick bite, lower back R side, since memorial day, rash started around the area 3 days ago.    HPI Lorraine Owen presents for tick bite recently around [facility] Day.  She and her family have a home at [facility].  They do a lot of hiking.  Also have animals that go in and out and she thinks she may have gotten a tick from one of their pets.  She actually pulled off two ticks from her right flank region.  This was around [facility] Day but then she developed some rash which was somewhat circumferential around this area just a week or so ago.  Rash is starting to fade at this time.  No headaches.  No arthralgias.  No fever.  No myalgias.  She does think this was a deer tick.  She has had a couple of [place] tick bites earlier this year but has not had any recurrent hives or clinical suspicion for alpha gal allergy.  Past Medical History:  Diagnosis Date   DDD (degenerative disc disease), cervical    Distal radial fracture    left   Migraine    Periodic limb movement disorder (PLMD)    Snoring     Past Surgical History:  Procedure Laterality Date   breast     cyst removal   OPEN REDUCTION INTERNAL FIXATION (ORIF) DISTAL RADIAL FRACTURE Left 12/17/2015   Procedure: OPEN TREATMENT OF LEFT DISTAL RADIUS FRACTURE;  Surgeon: Milly Jakob, MD;  Location: [facility];  Service: Orthopedics;  Laterality: Left;  GENERAL ANESTHESIA WITH PRE-OP BLOCK   TOE SURGERY Left 2017   WISDOM TOOTH EXTRACTION      Family History  Problem Relation Age of Onset   Migraines Mother    Thyroid disease Mother    Skin cancer Father    Prostate cancer Father 45   Kidney failure Father 22       after radiation for cancer; dialysis x 4 years   Healthy Father    Obesity Sister    Obesity  Brother    Leukemia Maternal Grandmother    Bone cancer Paternal Grandmother    Obesity Sister    Obesity Sister    Obesity Brother    Obesity Sister     Social History   Socioeconomic History   Marital status: Married    Spouse name: Dellis Filbert   Number of children: 2   Years of education: college   Highest education level: Not on file  Occupational History   Occupation: Engineer, maintenance (IT)  Tobacco Use   Smoking status: Never   Smokeless tobacco: Never  Substance and Sexual Activity   Alcohol use: No   Drug use: No   Sexual activity: Not on file  Other Topics Concern   Not on file  Social History Narrative   Not on file   Social Determinants of Health   Financial Resource Strain: Not on file  Food Insecurity: Not on file  Transportation Needs: Not on file  Physical Activity: Not on file  Stress: Not on file  Social Connections: Not on file  Intimate Partner Violence: Not on file    Outpatient Medications Prior to Visit  Medication Sig Dispense Refill   benzonatate (TESSALON PERLES) 100 MG capsule  Take 1 capsule (100 mg total) by mouth 3 (three) times daily as needed. 20 capsule 0   SUMAtriptan (IMITREX) 50 MG tablet Take 1 tablet (50 mg total) by mouth daily as needed for migraine. Ok to repeat dose in 2 hours if not getting resolution of migraine. Max 2 tablets in 24 hours. 10 tablet 2   fluticasone (FLOVENT HFA) 44 MCG/ACT inhaler Inhale 2 puffs into the lungs in the morning and at bedtime. 1 Inhaler 0   No facility-administered medications prior to visit.    Allergies  Allergen Reactions   Penicillins Rash and Dermatitis   Sulfa Antibiotics Rash and Dermatitis   Sulphamethoxydiazine Rash    ROS Review of Systems  Constitutional:  Negative for chills and fever.  Respiratory:  Negative for shortness of breath.   Musculoskeletal:  Negative for arthralgias.  Skin:  Positive for rash.  Neurological:  Negative for headaches.     Objective:    Physical Exam Vitals  reviewed.  Constitutional:      Appearance: Normal appearance.  Cardiovascular:     Rate and Rhythm: Normal rate and regular rhythm.  Pulmonary:     Effort: Pulmonary effort is normal.     Breath sounds: Normal breath sounds.  Skin:    Comments: Right posterior flank region reveals couple of small punctate areas where previous ticks had bitten him.  No visible retained foreign bodies.  She has approximately 6 x 6 cm area of fading erythema which blanches.  No concentric circles .  No scaly rash.  Neurological:     Mental Status: She is alert.    BP 128/74 (BP Location: Left Arm, Patient Position: Sitting, Cuff Size: Normal)   Pulse 83   Temp 98 F (36.7 C) (Oral)   Wt 136 lb 14.4 oz (62.1 kg)   LMP 11/16/2014   SpO2 97%   BMI 22.43 kg/m  Wt Readings from Last 3 Encounters:  08/06/20 136 lb 14.4 oz (62.1 kg)  02/04/18 145 lb (65.8 kg)  01/31/18 144 lb 9.6 oz (65.6 kg)     Health Maintenance Due  Topic Date Due   COVID-19 Vaccine (1) Never done   HIV Screening  Never done   Hepatitis C Screening  Never done   PAP SMEAR-Modifier  Never done   COLONOSCOPY (Pts 45-23yrs Insurance coverage will need to be confirmed)  Never done   MAMMOGRAM  Never done   Zoster Vaccines- Shingrix (1 of 2) Never done    There are no preventive care reminders to display for this patient.  No results found for: TSH Lab Results  Component Value Date   WBC 6.4 04/15/2014   HGB 13.3 04/15/2014   HCT 39.0 04/15/2014   MCV 89.6 04/15/2014   PLT 319 04/15/2014   Lab Results  Component Value Date   NA 141 04/15/2014   K 3.5 04/15/2014   CO2 27 04/15/2014   GLUCOSE 103 (H) 04/15/2014   BUN 13 04/15/2014   CREATININE 1.00 04/15/2014   BILITOT 0.1 (L) 04/15/2014   ALKPHOS 47 04/15/2014   AST 18 04/15/2014   ALT 13 04/15/2014   PROT 6.3 04/15/2014   ALBUMIN 3.8 04/15/2014   CALCIUM 8.8 04/15/2014   ANIONGAP 4 (L) 04/15/2014   No results found for: CHOL No results found for: HDL No  results found for: LDLCALC No results found for: TRIG No results found for: CHOLHDL No results found for: HGBA1C    Assessment & Plan:   Problem List Items  Addressed This Visit   None Visit Diagnoses     Tick bite, unspecified site, initial encounter    -  Primary   Relevant Orders   B. [place] Antibodies     Patient had tick bite approximately a month ago with actually a couple of small bites from reported deer tick right flank region.  Unusual is the fact that she did not develop any surrounding rash until about a week ago.  This is already starting to fade.  This does not look like classic erythema migrans.  She also does not have any other worrisome symptoms such as fever, arthralgia, myalgia  -Although clinical suspicion for Lyme disease is relatively low we decided to go ahead and check Lyme antibodies after discussing pros and cons of antibody testing including false positive and false negatives.  We also elected to go and cover with doxycycline 100 mg twice daily for 10 days given somewhat unusual presentation of rash well after the bite.  Meds ordered this encounter  Medications   doxycycline (VIBRAMYCIN) 100 MG capsule    Sig: Take 1 capsule (100 mg total) by mouth 2 (two) times daily.    Dispense:  20 capsule    Refill:  0    Follow-up: No follow-ups on file.    Carolann Littler, MD

## 2020-08-07 LAB — B. BURGDORFI ANTIBODIES: B burgdorferi Ab IgG+IgM: <0.9 index

## 2021-02-16 ENCOUNTER — Other Ambulatory Visit: Payer: Self-pay | Admitting: Family Medicine

## 2021-04-09 ENCOUNTER — Ambulatory Visit (INDEPENDENT_AMBULATORY_CARE_PROVIDER_SITE_OTHER): Payer: BC Managed Care – PPO | Admitting: Family Medicine

## 2021-04-09 ENCOUNTER — Encounter: Payer: Self-pay | Admitting: Family Medicine

## 2021-04-09 VITALS — BP 128/80 | HR 65 | Temp 97.8°F | Ht 65.5 in | Wt 155.7 lb

## 2021-04-09 DIAGNOSIS — R350 Frequency of micturition: Secondary | ICD-10-CM

## 2021-04-09 DIAGNOSIS — R1032 Left lower quadrant pain: Secondary | ICD-10-CM | POA: Diagnosis not present

## 2021-04-09 DIAGNOSIS — Z13220 Encounter for screening for lipoid disorders: Secondary | ICD-10-CM

## 2021-04-09 LAB — LIPID PANEL
Cholesterol: 210 mg/dL — ABNORMAL HIGH (ref 0–200)
HDL: 65.7 mg/dL (ref >39.00)
LDL Cholesterol: 129 mg/dL — ABNORMAL HIGH (ref 0–99)
NonHDL: 144.23
Total CHOL/HDL Ratio: 3
Triglycerides: 78 mg/dL (ref 0.0–149.0)
VLDL: 15.6 mg/dL (ref 0.0–40.0)

## 2021-04-09 LAB — URINALYSIS, ROUTINE W REFLEX MICROSCOPIC
Bilirubin Urine: NEGATIVE
Hgb urine dipstick: NEGATIVE
Ketones, ur: NEGATIVE
Nitrite: NEGATIVE
RBC / HPF: NONE SEEN (ref 0–?)
Specific Gravity, Urine: <=1.005 — AB (ref 1.000–1.030)
Total Protein, Urine: NEGATIVE
Urine Glucose: NEGATIVE
Urobilinogen, UA: 0.2 (ref 0.0–1.0)
pH: 6.5 (ref 5.0–8.0)

## 2021-04-09 LAB — COMPREHENSIVE METABOLIC PANEL
ALT: 13 U/L (ref 0–35)
AST: 15 U/L (ref 0–37)
Albumin: 4.4 g/dL (ref 3.5–5.2)
Alkaline Phosphatase: 65 U/L (ref 39–117)
BUN: 16 mg/dL (ref 6–23)
CO2: 33 mEq/L — ABNORMAL HIGH (ref 19–32)
Calcium: 9.1 mg/dL (ref 8.4–10.5)
Chloride: 105 mEq/L (ref 96–112)
Creatinine, Ser: 0.92 mg/dL (ref 0.40–1.20)
GFR: 67.91 mL/min (ref >60.00)
Glucose, Bld: 86 mg/dL (ref 70–99)
Potassium: 3.9 mEq/L (ref 3.5–5.1)
Sodium: 141 mEq/L (ref 135–145)
Total Bilirubin: 0.4 mg/dL (ref 0.2–1.2)
Total Protein: 6.7 g/dL (ref 6.0–8.3)

## 2021-04-09 LAB — CBC WITH DIFFERENTIAL/PLATELET
Basophils Absolute: 0 10*3/uL (ref 0.0–0.1)
Basophils Relative: 0.5 % (ref 0.0–3.0)
Eosinophils Absolute: 0.1 10*3/uL (ref 0.0–0.7)
Eosinophils Relative: 1.4 % (ref 0.0–5.0)
HCT: 40.1 % (ref 36.0–46.0)
Hemoglobin: 13.3 g/dL (ref 12.0–15.0)
Lymphocytes Relative: 30.7 % (ref 12.0–46.0)
Lymphs Abs: 1.7 10*3/uL (ref 0.7–4.0)
MCHC: 33 g/dL (ref 30.0–36.0)
MCV: 89.3 fl (ref 78.0–100.0)
Monocytes Absolute: 0.3 10*3/uL (ref 0.1–1.0)
Monocytes Relative: 5.8 % (ref 3.0–12.0)
Neutro Abs: 3.4 10*3/uL (ref 1.4–7.7)
Neutrophils Relative %: 61.6 % (ref 43.0–77.0)
Platelets: 295 10*3/uL (ref 150.0–400.0)
RBC: 4.49 Mil/uL (ref 3.87–5.11)
RDW: 12.9 % (ref 11.5–15.5)
WBC: 5.6 10*3/uL (ref 4.0–10.5)

## 2021-04-09 NOTE — Progress Notes (Signed)
Lorraine Owen DOB: [birth year] Encounter date: 04/09/2021  This is a 61 y.o. female who presents with Chief Complaint  Patient presents with   Pelvic Pain    Patient complains of LLQ pelvic pain x2 months, denies dysuria     History of present illness: Last visit with me was virtual 07/01/18 to establish care.  Pain comes and goes. Prior to pain would feel something funny there; maybe shooting pain. Then started feeling something funny. Most of time pain is a 4, but she was worried that it was there. Here to make sure it isn't something worrisome.   Hurts with coughing - just a flash of pain 4-5. There to a degree all the time.   No change in pain with bowel movements. She is less regular with bowels. Stools not firm, but no diarrhea. No blood in the stools.   Had colonoscopy with [facility] 2 years ago. States it was normal.   Urinates regularly. Feels like she is urinating all the time, but this has been going on for awhile.  Some lower back pain - but did just move houses.   No fevers, chills. Does feel bloated. But she states that she is eating too much, gaining weight.  No prior gyn or pelvic surgeries.    Allergies  Allergen Reactions   Penicillins Rash and Dermatitis   Sulfa Antibiotics Rash and Dermatitis   Sulphamethoxydiazine Rash   Current Meds  Medication Sig   SUMAtriptan (IMITREX) 50 MG tablet TAKE 1 TABLET BY MOUTH EVERY DAY AS NEEDED FOR MIGRAINE. REPEAT DOSE IN 2 HOURS IF NEEDED   valACYclovir (VALTREX) 500 MG tablet Take 500 mg by mouth 2 (two) times daily as needed.    Review of Systems  Constitutional:  Negative for chills, fatigue and fever.  Respiratory:  Negative for cough, chest tightness, shortness of breath and wheezing.   Cardiovascular:  Negative for chest pain, palpitations and leg swelling.  Gastrointestinal:  Positive for abdominal pain. Negative for abdominal distention (not distended, but feels bloated on/off more often), anal bleeding,  blood in stool, constipation, diarrhea, nausea, rectal pain and vomiting.  Genitourinary:  Positive for frequency and pelvic pain. Negative for difficulty urinating, dyspareunia, dysuria, hematuria, vaginal bleeding, vaginal discharge and vaginal pain.   Objective:  BP 128/80 (BP Location: Left Arm, Patient Position: Sitting, Cuff Size: Normal)    Pulse 65    Temp 97.8 F (36.6 C) (Oral)    Ht 5' 5.5" (1.664 m)    Wt 155 lb 11.2 oz (70.6 kg)    LMP 11/16/2014    SpO2 98%    BMI 25.52 kg/m   Weight: 155 lb 11.2 oz (70.6 kg)   BP Readings from Last 3 Encounters:  04/09/21 128/80  08/06/20 128/74  02/04/18 105/65   Wt Readings from Last 3 Encounters:  04/09/21 155 lb 11.2 oz (70.6 kg)  08/06/20 136 lb 14.4 oz (62.1 kg)  02/04/18 145 lb (65.8 kg)    Physical Exam Constitutional:      General: She is not in acute distress.    Appearance: She is well-developed.  Cardiovascular:     Rate and Rhythm: Normal rate and regular rhythm.     Heart sounds: Normal heart sounds. No murmur heard.   No friction rub.  Pulmonary:     Effort: Pulmonary effort is normal. No respiratory distress.     Breath sounds: Normal breath sounds. No wheezing or rales.  Abdominal:     General: Abdomen is flat.  Bowel sounds are normal.     Palpations: Abdomen is soft.     Tenderness: There is abdominal tenderness in the left lower quadrant. There is no right CVA tenderness, left CVA tenderness, guarding or rebound. Negative signs include Murphy's sign.     Hernia: Hernia: no hernia appreciated, but she is tender along inguinal line. There is no hernia in the umbilical area.  Musculoskeletal:     Right lower leg: No edema.     Left lower leg: No edema.  Neurological:     Mental Status: She is alert and oriented to person, place, and time.  Psychiatric:        Behavior: Behavior normal.    Assessment/Plan 1. Frequent urination - Urinalysis with Reflex Microscopic; Future - Urinalysis with Reflex  Microscopic  2. LLQ pain Start with labwork, [name] eval. Consider further eval pending this. Localized tenderness worth with increased intraabd pressure; may consider surg eval if above negative.  - US Pelvis Complete; Future - Comprehensive metabolic panel; Future - CBC with Differential/Platelet; Future - CBC with Differential/Platelet - Comprehensive metabolic panel  3. Lipid screening - Lipid panel; Future - Lipid panel   Return for pending lab or imaging results.      Micheline Rough, MD

## 2021-04-10 ENCOUNTER — Telehealth: Payer: Self-pay | Admitting: Family Medicine

## 2021-04-10 NOTE — Telephone Encounter (Signed)
Lorraine Owen with [facility] is calling to see if US pelvis complete with or without transvaginal

## 2021-04-11 NOTE — Telephone Encounter (Signed)
with

## 2021-04-11 NOTE — Telephone Encounter (Signed)
Spoke with Elmyra Ricks and informed her of the message below.

## 2021-04-15 ENCOUNTER — Ambulatory Visit
Admission: RE | Admit: 2021-04-15 | Discharge: 2021-04-15 | Disposition: A | Discharge status: Home / Self Care | Payer: BC Managed Care – PPO | Source: Ambulatory Visit | Attending: Family Medicine | Admitting: Family Medicine

## 2021-04-15 DIAGNOSIS — R1032 Left lower quadrant pain: Secondary | ICD-10-CM | POA: Diagnosis not present

## 2021-04-15 DIAGNOSIS — Z780 Asymptomatic menopausal state: Secondary | ICD-10-CM | POA: Diagnosis not present

## 2021-04-15 DIAGNOSIS — R1031 Right lower quadrant pain: Secondary | ICD-10-CM | POA: Diagnosis not present

## 2021-04-16 NOTE — Progress Notes (Signed)
Will disregard req for urine culture as specimen too old.

## 2021-04-17 ENCOUNTER — Encounter: Payer: Self-pay | Admitting: Family Medicine

## 2021-04-18 ENCOUNTER — Telehealth: Payer: Self-pay | Admitting: Family Medicine

## 2021-04-18 ENCOUNTER — Telehealth: Payer: Self-pay

## 2021-04-18 NOTE — Telephone Encounter (Signed)
See prior note

## 2021-04-18 NOTE — Telephone Encounter (Signed)
See results note. 

## 2021-04-18 NOTE — Telephone Encounter (Signed)
Pt call and want a call back about her labs results . ?

## 2021-04-18 NOTE — Telephone Encounter (Signed)
Patient would like a call back to discuss Rx for ua results ?

## 2021-05-01 ENCOUNTER — Telehealth: Payer: Self-pay

## 2021-05-01 NOTE — Telephone Encounter (Signed)
Patient called stating it is urgent that lab results are sent to Gyn as soon as possible  ?Dr.Sydeny Rogue Bussing ?Fax [phone] ? ? ?

## 2021-05-02 NOTE — Telephone Encounter (Signed)
Verified with pt what results she was referring to; pt would like most recent lab results done in Feb along with U/S to be faxed to Dr Rogue Bussing. Will fax with pt's verbal consent; she is advised to sign forms in the future or have other office notify us if future records needed. Pt verb understanding. ?

## 2021-05-02 NOTE — Telephone Encounter (Signed)
Pt is calling and the Gyn office needs the lab result before they can sch her an appt ?

## 2021-05-06 DIAGNOSIS — Z6826 Body mass index (BMI) 26.0-26.9, adult: Secondary | ICD-10-CM | POA: Diagnosis not present

## 2021-05-06 DIAGNOSIS — N859 Noninflammatory disorder of uterus, unspecified: Secondary | ICD-10-CM | POA: Diagnosis not present

## 2021-07-10 DIAGNOSIS — Z01419 Encounter for gynecological examination (general) (routine) without abnormal findings: Secondary | ICD-10-CM | POA: Diagnosis not present

## 2021-07-10 DIAGNOSIS — Z124 Encounter for screening for malignant neoplasm of cervix: Secondary | ICD-10-CM | POA: Diagnosis not present

## 2021-07-11 ENCOUNTER — Other Ambulatory Visit: Payer: Self-pay | Admitting: Obstetrics and Gynecology

## 2021-07-11 DIAGNOSIS — Z1231 Encounter for screening mammogram for malignant neoplasm of breast: Secondary | ICD-10-CM

## 2021-07-15 ENCOUNTER — Ambulatory Visit
Admission: RE | Admit: 2021-07-15 | Discharge: 2021-07-15 | Disposition: A | Discharge status: Home / Self Care | Payer: BC Managed Care – PPO | Source: Ambulatory Visit | Attending: Obstetrics and Gynecology | Admitting: Obstetrics and Gynecology

## 2021-07-15 DIAGNOSIS — Z1231 Encounter for screening mammogram for malignant neoplasm of breast: Secondary | ICD-10-CM

## 2021-08-08 DIAGNOSIS — H35413 Lattice degeneration of retina, bilateral: Secondary | ICD-10-CM | POA: Diagnosis not present

## 2021-08-08 DIAGNOSIS — H43813 Vitreous degeneration, bilateral: Secondary | ICD-10-CM | POA: Diagnosis not present

## 2021-10-08 DIAGNOSIS — H43813 Vitreous degeneration, bilateral: Secondary | ICD-10-CM | POA: Diagnosis not present

## 2021-10-08 DIAGNOSIS — H35413 Lattice degeneration of retina, bilateral: Secondary | ICD-10-CM | POA: Diagnosis not present

## 2021-10-23 ENCOUNTER — Telehealth (INDEPENDENT_AMBULATORY_CARE_PROVIDER_SITE_OTHER): Payer: BC Managed Care – PPO | Admitting: Adult Health

## 2021-10-23 ENCOUNTER — Encounter: Payer: Self-pay | Admitting: Adult Health

## 2021-10-23 VITALS — Temp 103.0°F | Wt 155.0 lb

## 2021-10-23 DIAGNOSIS — U071 COVID-19: Secondary | ICD-10-CM

## 2021-10-23 MED ORDER — NIRMATRELVIR/RITONAVIR (PAXLOVID)TABLET: 3.0000 | ORAL_TABLET | Freq: Two times a day (BID) | ORAL | 0 refills | Status: AC

## 2021-10-23 NOTE — Progress Notes (Signed)
Virtual Visit via Video Note  I connected with Clovia Cuff on 10/23/21 at  4:00 PM EDT by a video enabled telemedicine application and verified that I am speaking with the correct person using two identifiers.  Location patient: home Location provider:work or home office Persons participating in the virtual visit: patient, provider  I discussed the limitations of evaluation and management by telemedicine and the availability of in person appointments. The patient expressed understanding and agreed to proceed.   HPI: 61 year old female who is being evaluated today for an acute issue.  She tested positive for COVID-19 this morning, her symptoms started 2 days ago.  Symptoms include body aches, joint pain, sore throat, productive cough with discolored mucus, headache, fever up to 103 degrees, congestion, and fatigue.  She denies shortness of breath.  At home she has been using Tylenol and Advil.  She has had COVID-19 vaccinations   ROS: See pertinent positives and negatives per HPI.  Past Medical History:  Diagnosis Date   DDD (degenerative disc disease), cervical    Distal radial fracture    left   Migraine    Periodic limb movement disorder (PLMD)    Snoring     Past Surgical History:  Procedure Laterality Date   breast     cyst removal   OPEN REDUCTION INTERNAL FIXATION (ORIF) DISTAL RADIAL FRACTURE Left 12/17/2015   Procedure: OPEN TREATMENT OF LEFT DISTAL RADIUS FRACTURE;  Surgeon: Milly Jakob, MD;  Location: [facility];  Service: Orthopedics;  Laterality: Left;  GENERAL ANESTHESIA WITH PRE-OP BLOCK   TOE SURGERY Left 2017   WISDOM TOOTH EXTRACTION      Family History  Problem Relation Age of Onset   Migraines Mother    Thyroid disease Mother    Skin cancer Father    Prostate cancer Father 49   Kidney failure Father 107       after radiation for cancer; dialysis x 4 years   Healthy Father    Obesity Sister    Obesity Brother    Leukemia Maternal  Grandmother    Bone cancer Paternal Grandmother    Obesity Sister    Obesity Sister    Obesity Brother    Obesity Sister        Current Outpatient Medications:    SUMAtriptan (IMITREX) 50 MG tablet, TAKE 1 TABLET BY MOUTH EVERY DAY AS NEEDED FOR MIGRAINE. REPEAT DOSE IN 2 HOURS IF NEEDED, Disp: 10 tablet, Rfl: 0   valACYclovir (VALTREX) 500 MG tablet, Take 500 mg by mouth 2 (two) times daily as needed., Disp: , Rfl:   EXAM:  VITALS per patient if applicable:  GENERAL: alert, oriented, appears acutely ill and in no acute distress  HEENT: atraumatic, conjunttiva clear, no obvious abnormalities on inspection of external nose and ears  NECK: normal movements of the head and neck  LUNGS: on inspection no signs of respiratory distress, breathing rate appears normal, no obvious gross SOB, gasping or wheezing  CV: no obvious cyanosis  MS: moves all visible extremities without noticeable abnormality  PSYCH/NEURO: pleasant and cooperative, no obvious depression or anxiety, speech and thought processing grossly intact  ASSESSMENT AND PLAN:  Discussed the following assessment and plan:  1. COVID-19 virus infection - reviewed side effects of medications and red flags. Will treat with Paxlovid due to symptoms.  - nirmatrelvir/ritonavir EUA (PAXLOVID) 20 x 150 MG & 10 x '100MG'$  TABS; Take 3 tablets by mouth 2 (two) times daily for 5 days. (Take nirmatrelvir 150  mg two tablets twice daily for 5 days and ritonavir 100 mg one tablet twice daily for 5 days) Patient GFR is 67  Dispense: 30 tablet; Refill: 0      I discussed the assessment and treatment plan with the patient. The patient was provided an opportunity to ask questions and all were answered. The patient agreed with the plan and demonstrated an understanding of the instructions.   The patient was advised to call back or seek an in-person evaluation if the symptoms worsen or if the condition fails to improve as  anticipated.   Dorothyann Peng, NP

## 2021-10-24 ENCOUNTER — Telehealth: Payer: BC Managed Care – PPO | Admitting: Adult Health

## 2021-11-19 ENCOUNTER — Other Ambulatory Visit: Payer: Self-pay | Admitting: Obstetrics and Gynecology

## 2021-11-19 DIAGNOSIS — N6310 Unspecified lump in the right breast, unspecified quadrant: Secondary | ICD-10-CM | POA: Diagnosis not present

## 2021-11-19 DIAGNOSIS — Z6826 Body mass index (BMI) 26.0-26.9, adult: Secondary | ICD-10-CM | POA: Diagnosis not present

## 2022-01-01 ENCOUNTER — Ambulatory Visit
Admission: RE | Admit: 2022-01-01 | Discharge: 2022-01-01 | Disposition: A | Discharge status: Home / Self Care | Payer: BC Managed Care – PPO | Source: Ambulatory Visit | Attending: Obstetrics and Gynecology | Admitting: Obstetrics and Gynecology

## 2022-01-01 ENCOUNTER — Other Ambulatory Visit: Payer: Self-pay | Admitting: Obstetrics and Gynecology

## 2022-01-01 DIAGNOSIS — N644 Mastodynia: Secondary | ICD-10-CM | POA: Diagnosis not present

## 2022-01-01 DIAGNOSIS — N6310 Unspecified lump in the right breast, unspecified quadrant: Secondary | ICD-10-CM

## 2022-02-27 ENCOUNTER — Institutional Professional Consult (permissible substitution): Payer: BC Managed Care – PPO | Admitting: Plastic Surgery

## 2022-04-09 DIAGNOSIS — M2022 Hallux rigidus, left foot: Secondary | ICD-10-CM | POA: Diagnosis not present

## 2022-04-09 DIAGNOSIS — M2021 Hallux rigidus, right foot: Secondary | ICD-10-CM | POA: Diagnosis not present

## 2022-05-26 ENCOUNTER — Ambulatory Visit (INDEPENDENT_AMBULATORY_CARE_PROVIDER_SITE_OTHER): Payer: Self-pay | Admitting: Plastic Surgery

## 2022-05-26 ENCOUNTER — Encounter: Payer: Self-pay | Admitting: Plastic Surgery

## 2022-05-26 VITALS — BP 117/72 | HR 72 | Ht 65.0 in | Wt 161.4 lb

## 2022-05-26 DIAGNOSIS — Z719 Counseling, unspecified: Secondary | ICD-10-CM

## 2022-05-26 NOTE — Progress Notes (Addendum)
Patient ID: Lorraine Owen, female    DOB: 1960/06/08, 62 y.o.   MRN: [id number]   Chief Complaint  Patient presents with   Advice Only   Breast Problem    The patient is a [age]-year-old female here for evaluation of her breasts.  She is 5 feet 5 inches tall and weighs 161 pounds.  She had saline implants placed approximately 20 years ago by Dr. Benna Owen.  She does not know what size they are but is trying to find her card.  She is not a smoker, does not have diabetes, and is not on a blood thinner.  She has no family history of breast cancer.  Her last mammogram was November 2023 and was negative.  She gets them yearly and she has never had a problem.  She would describe her current bra size as a C.  Since they have been in so long she is thinking she should have them exchanged or removed.  She is going to think about which she would rather do.  She has some mild ptosis worse on the right compared to the left.  They were placed from the inframammary fold but she did have areolar reductions and so the scars are noted.    Review of Systems  Constitutional: Negative.   HENT: Negative.    Eyes: Negative.   Respiratory: Negative.  Negative for chest tightness and shortness of breath.   Cardiovascular: Negative.   Gastrointestinal: Negative.   Endocrine: Negative.   Genitourinary: Negative.   Musculoskeletal: Negative.     Past Medical History:  Diagnosis Date   DDD (degenerative disc disease), cervical    Distal radial fracture    left   Migraine    Periodic limb movement disorder (PLMD)    Snoring     Past Surgical History:  Procedure Laterality Date   breast     cyst removal   OPEN REDUCTION INTERNAL FIXATION (ORIF) DISTAL RADIAL FRACTURE Left 12/17/2015   Procedure: OPEN TREATMENT OF LEFT DISTAL RADIUS FRACTURE;  Surgeon: Lorraine Hook, MD;  Location: [facility];  Service: Orthopedics;  Laterality: Left;  GENERAL ANESTHESIA WITH PRE-OP BLOCK   TOE SURGERY  Left 2017   WISDOM TOOTH EXTRACTION        Current Outpatient Medications:    SUMAtriptan (IMITREX) 50 MG tablet, TAKE 1 TABLET BY MOUTH EVERY DAY AS NEEDED FOR MIGRAINE. REPEAT DOSE IN 2 HOURS IF NEEDED, Disp: 10 tablet, Rfl: 0   valACYclovir (VALTREX) 500 MG tablet, Take 500 mg by mouth 2 (two) times daily as needed., Disp: , Rfl:    Objective:   Vitals:   05/26/22 1050  BP: 117/72  Pulse: 72  SpO2: 98%    Physical Exam Vitals and nursing note reviewed.  Constitutional:      Appearance: Normal appearance.  HENT:     Head: Normocephalic and atraumatic.  Cardiovascular:     Rate and Rhythm: Normal rate.     Pulses: Normal pulses.  Pulmonary:     Effort: Pulmonary effort is normal.  Abdominal:     General: There is no distension.     Palpations: Abdomen is soft.  Musculoskeletal:        General: No swelling or deformity.  Skin:    General: Skin is warm.     Capillary Refill: Capillary refill takes less than 2 seconds.     Coloration: Skin is not jaundiced.     Findings: No bruising.  Neurological:  Mental Status: She is alert and oriented to person, place, and time.  Psychiatric:        Mood and Affect: Mood normal.        Behavior: Behavior normal.        Thought Content: Thought content normal.        Judgment: Judgment normal.     Assessment & Plan:  Encounter for counseling  We requested release of information from Dr. Derek Owen office to find out what size implants she has in.  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  She will let us know what she wants then.  Pictures were obtained of the patient and placed in the chart with the patient's or guardian's permission.  06/04/2021 - The patient has [employer] saline implants [phone], 325 cc implants filled to 375 cc,.  Lorraine Owen Lorraine Hinzman, DO

## 2022-06-08 ENCOUNTER — Encounter: Payer: Self-pay | Admitting: *Deleted

## 2022-06-22 ENCOUNTER — Telehealth: Payer: Self-pay

## 2022-06-22 NOTE — Telephone Encounter (Signed)
Called patient, LMVM if she had any questions regarding the quote sent Joss sent for remove and replace implants w/ w/o mastopexy. Sent message via Mychart as well.

## 2022-06-30 ENCOUNTER — Telehealth: Payer: Self-pay | Admitting: *Deleted

## 2022-06-30 NOTE — Telephone Encounter (Signed)
Call placed to f/u on surgery quote sent thru mychart on 06/08/2022. Mychart messages are listed as unread. NA/LVM

## 2022-07-02 ENCOUNTER — Other Ambulatory Visit: Payer: Self-pay | Admitting: Obstetrics and Gynecology

## 2022-07-02 ENCOUNTER — Ambulatory Visit
Admission: RE | Admit: 2022-07-02 | Discharge: 2022-07-02 | Disposition: A | Discharge status: Home / Self Care | Payer: BC Managed Care – PPO | Source: Ambulatory Visit | Attending: Obstetrics and Gynecology | Admitting: Obstetrics and Gynecology

## 2022-07-02 DIAGNOSIS — N6310 Unspecified lump in the right breast, unspecified quadrant: Secondary | ICD-10-CM

## 2022-07-02 DIAGNOSIS — N63 Unspecified lump in unspecified breast: Secondary | ICD-10-CM | POA: Diagnosis not present

## 2022-07-20 ENCOUNTER — Ambulatory Visit
Admission: RE | Admit: 2022-07-20 | Discharge: 2022-07-20 | Disposition: A | Discharge status: Home / Self Care | Payer: BC Managed Care – PPO | Source: Ambulatory Visit | Attending: Obstetrics and Gynecology | Admitting: Obstetrics and Gynecology

## 2022-07-20 ENCOUNTER — Other Ambulatory Visit: Payer: Self-pay | Admitting: Obstetrics and Gynecology

## 2022-07-20 DIAGNOSIS — N6310 Unspecified lump in the right breast, unspecified quadrant: Secondary | ICD-10-CM

## 2022-07-20 DIAGNOSIS — R92323 Mammographic fibroglandular density, bilateral breasts: Secondary | ICD-10-CM | POA: Diagnosis not present

## 2022-07-31 DIAGNOSIS — R051 Acute cough: Secondary | ICD-10-CM | POA: Diagnosis not present

## 2022-07-31 DIAGNOSIS — R1013 Epigastric pain: Secondary | ICD-10-CM | POA: Diagnosis not present

## 2022-07-31 DIAGNOSIS — I498 Other specified cardiac arrhythmias: Secondary | ICD-10-CM | POA: Diagnosis not present

## 2022-07-31 DIAGNOSIS — Z20822 Contact with and (suspected) exposure to covid-19: Secondary | ICD-10-CM | POA: Diagnosis not present

## 2022-07-31 DIAGNOSIS — J029 Acute pharyngitis, unspecified: Secondary | ICD-10-CM | POA: Diagnosis not present

## 2022-10-05 DIAGNOSIS — J029 Acute pharyngitis, unspecified: Secondary | ICD-10-CM | POA: Diagnosis not present

## 2022-10-05 DIAGNOSIS — R051 Acute cough: Secondary | ICD-10-CM | POA: Diagnosis not present

## 2022-10-05 DIAGNOSIS — Z20822 Contact with and (suspected) exposure to covid-19: Secondary | ICD-10-CM | POA: Diagnosis not present

## 2022-11-09 DIAGNOSIS — Z6826 Body mass index (BMI) 26.0-26.9, adult: Secondary | ICD-10-CM | POA: Diagnosis not present

## 2022-11-09 DIAGNOSIS — Z124 Encounter for screening for malignant neoplasm of cervix: Secondary | ICD-10-CM | POA: Diagnosis not present

## 2022-11-09 DIAGNOSIS — Z01419 Encounter for gynecological examination (general) (routine) without abnormal findings: Secondary | ICD-10-CM | POA: Diagnosis not present

## 2022-12-21 ENCOUNTER — Other Ambulatory Visit: Payer: Self-pay | Admitting: Obstetrics and Gynecology

## 2022-12-21 ENCOUNTER — Ambulatory Visit
Admission: RE | Admit: 2022-12-21 | Discharge: 2022-12-21 | Disposition: A | Discharge status: Home / Self Care | Payer: BC Managed Care – PPO | Source: Ambulatory Visit | Attending: Obstetrics and Gynecology | Admitting: Obstetrics and Gynecology

## 2022-12-21 DIAGNOSIS — N6310 Unspecified lump in the right breast, unspecified quadrant: Secondary | ICD-10-CM

## 2022-12-21 DIAGNOSIS — N6312 Unspecified lump in the right breast, upper inner quadrant: Secondary | ICD-10-CM | POA: Diagnosis not present

## 2024-03-08 IMAGING — MG DIGITAL SCREENING BREAST BILAT IMPLANT W/ TOMO W/ CAD
8 of 12 series · 8 of 28 positions shown · non-contrast
Comparison: Previous exam(s).

CLINICAL DATA: Screening.

EXAM:
DIGITAL SCREENING BILATERAL MAMMOGRAM WITH IMPLANTS, CAD AND
TOMOSYNTHESIS
TECHNIQUE: Bilateral screening digital craniocaudal and mediolateral oblique
mammograms were obtained. Bilateral screening digital breast
tomosynthesis was performed. The images were evaluated with
computer-aided detection. Standard and/or implant displaced views
were performed.

[L MLO]
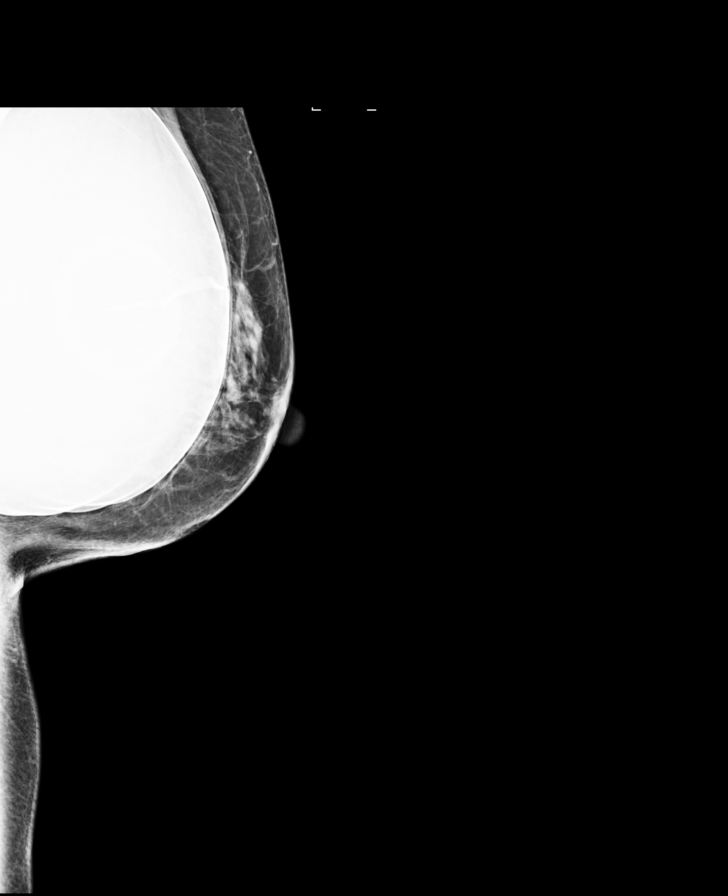

[L CC]
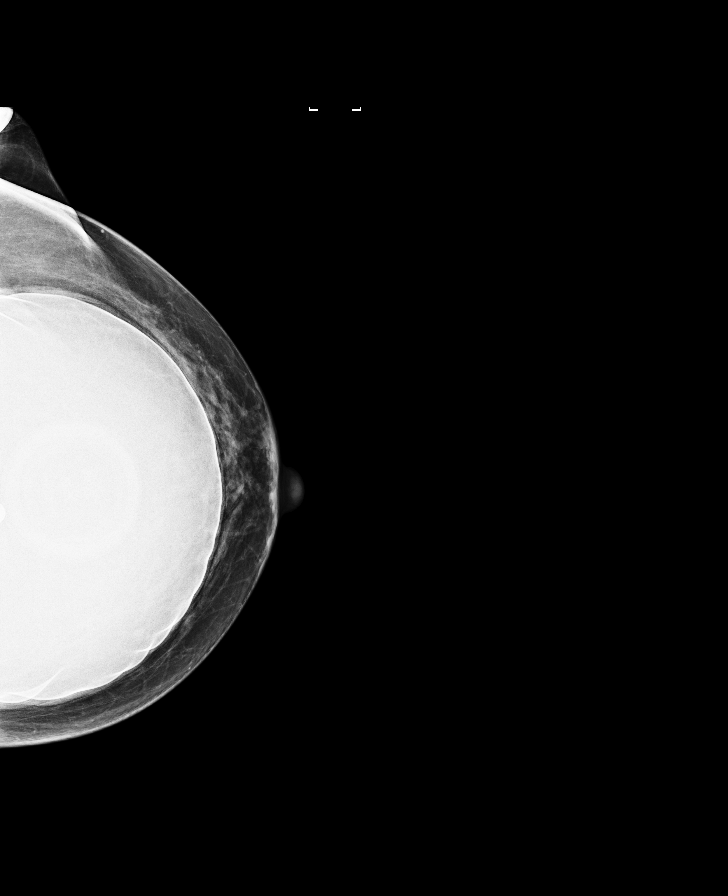

[R MLO]
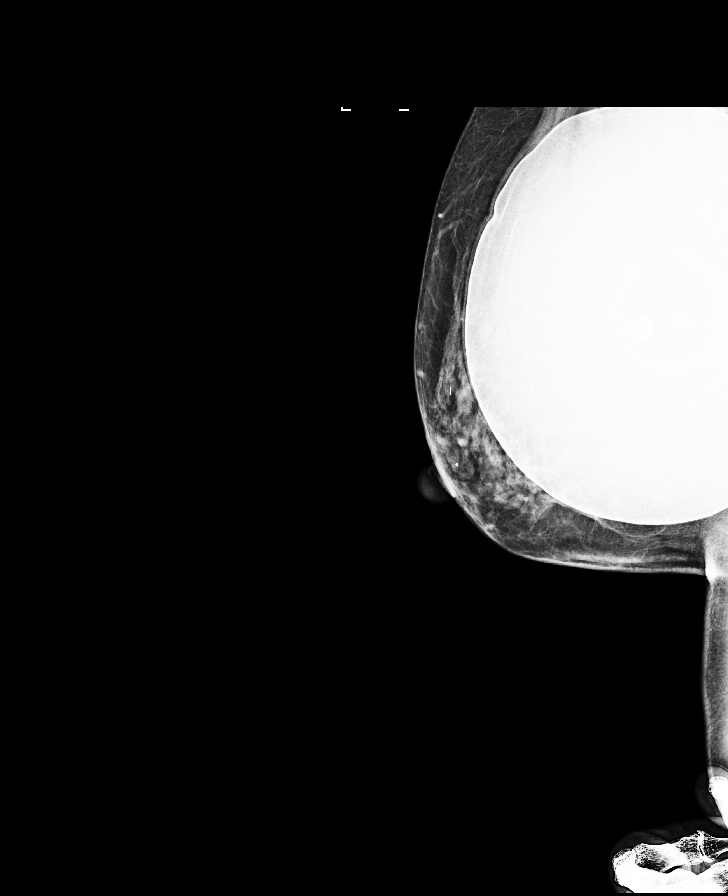

[R CC]
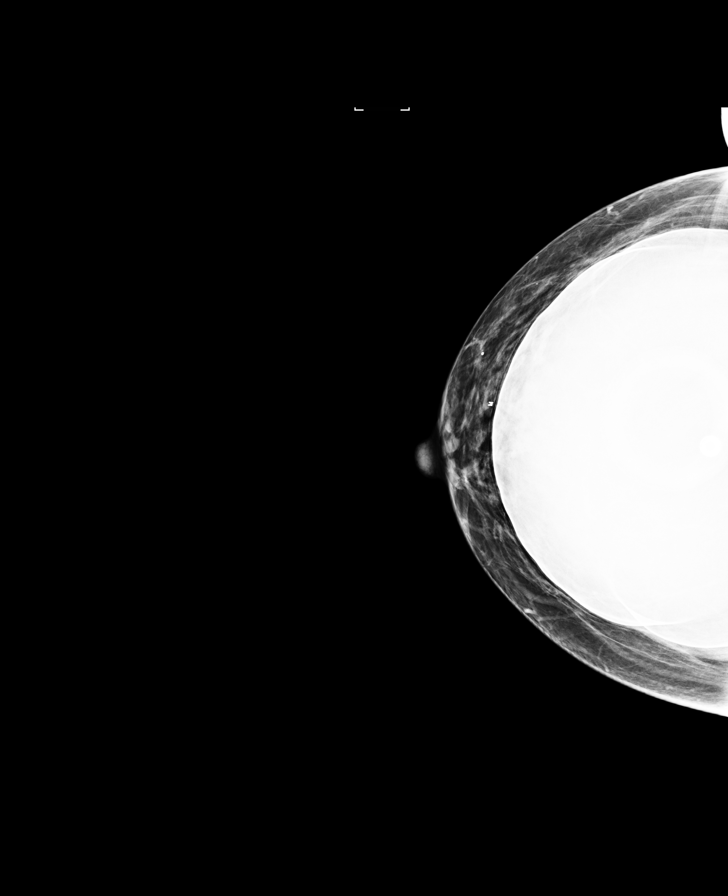

[R CC synth-2D]
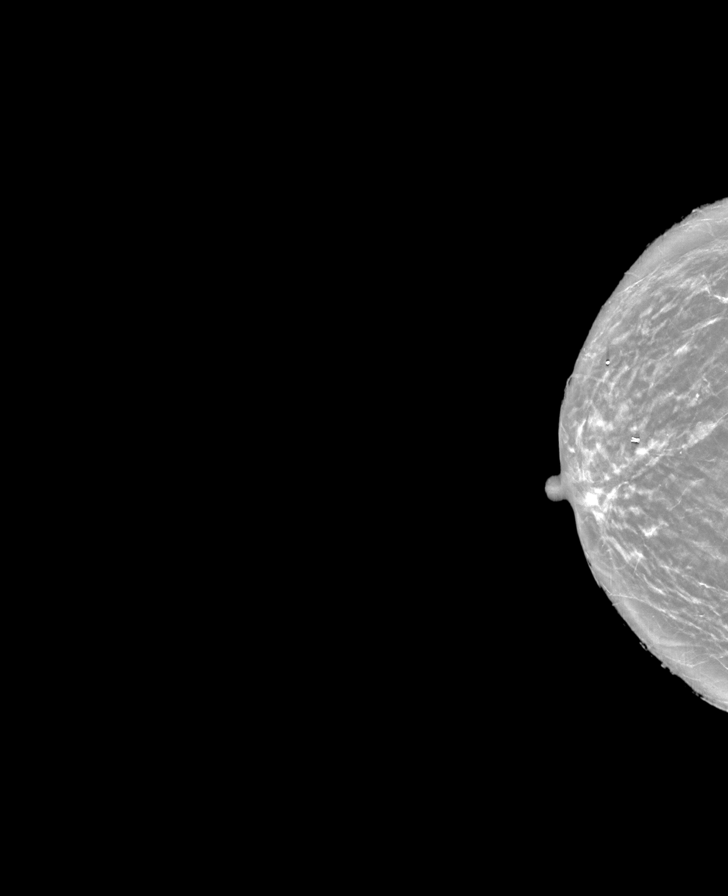

[R MLO synth-2D]
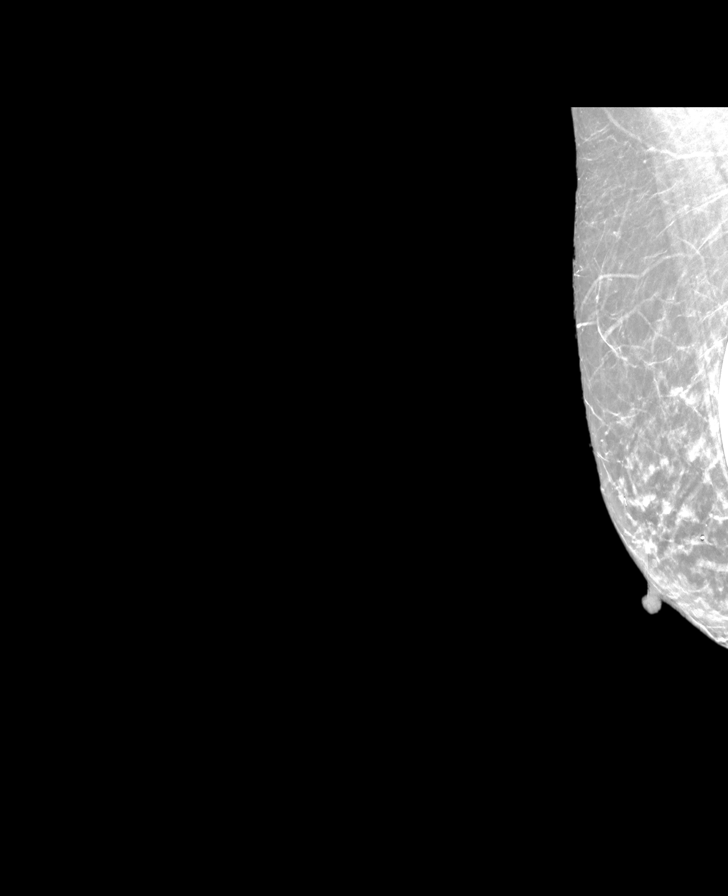

[L MLO synth-2D]
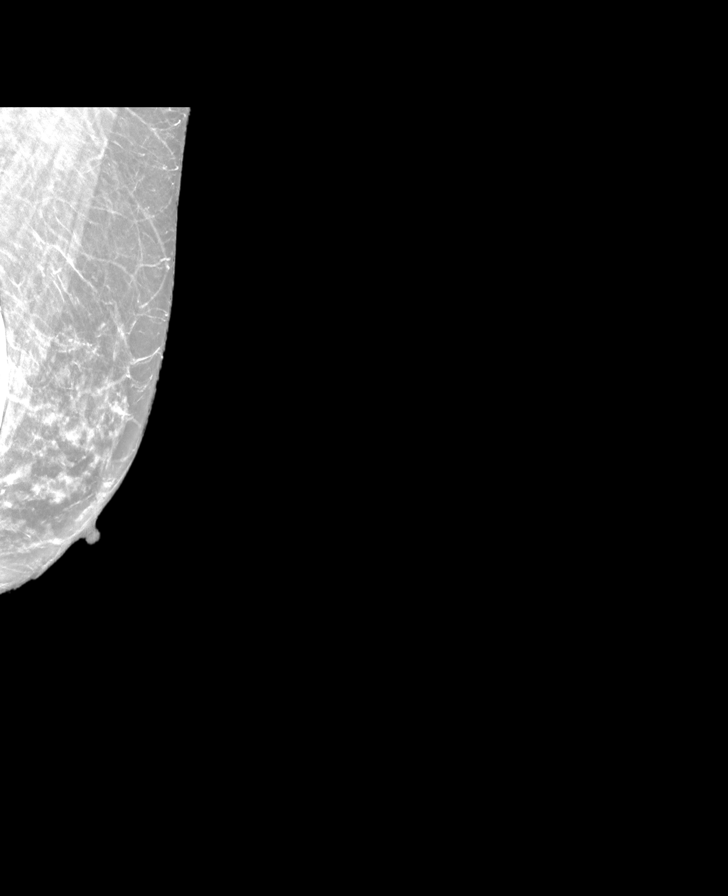

[L CC synth-2D]
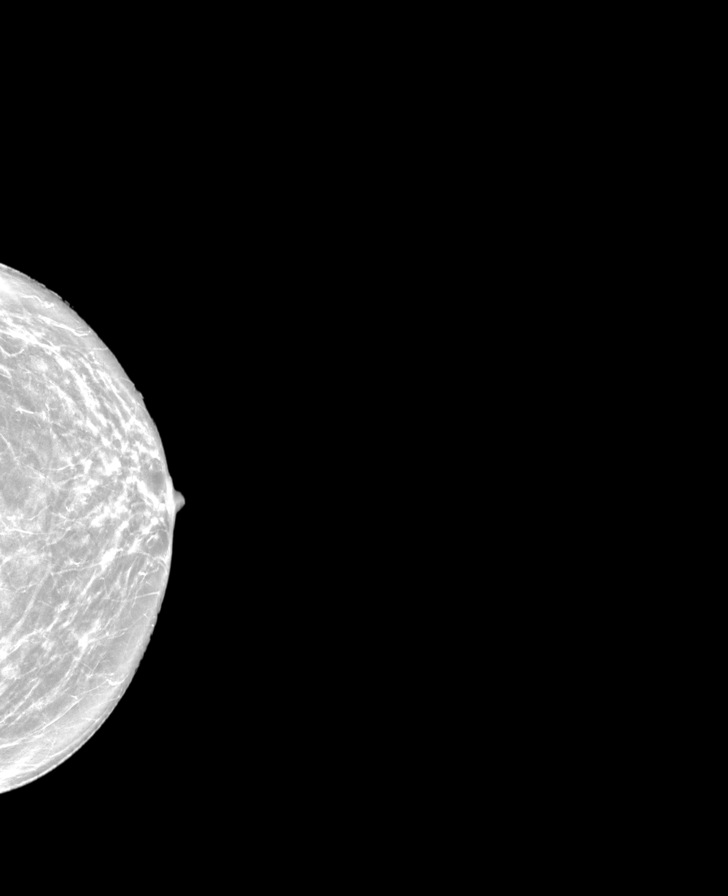

[8 of 28 positions shown; findings below may reference images not displayed]

ACR Breast Density Category c: The breast tissue is heterogeneously
dense, which may obscure small masses.
FINDINGS: The patient has retropectoral saline implants. There are no findings
suspicious for malignancy.
IMPRESSION: No mammographic evidence of malignancy. A result letter of this
screening mammogram will be mailed directly to the patient.

RECOMMENDATION:
Screening mammogram in one year. (Code:FQ-5-EOL)

BI-RADS CATEGORY  1:  Negative.

## 2024-03-09 ENCOUNTER — Other Ambulatory Visit: Payer: Self-pay | Admitting: Family Medicine

## 2024-03-09 ENCOUNTER — Encounter: Payer: Self-pay | Admitting: Family Medicine

## 2024-03-09 DIAGNOSIS — E509 Vitamin A deficiency, unspecified: Secondary | ICD-10-CM

## 2024-03-09 DIAGNOSIS — R947 Abnormal results of other endocrine function studies: Secondary | ICD-10-CM

## 2024-03-09 DIAGNOSIS — Z8249 Family history of ischemic heart disease and other diseases of the circulatory system: Secondary | ICD-10-CM

## 2024-03-09 DIAGNOSIS — R5383 Other fatigue: Secondary | ICD-10-CM

## 2024-03-09 DIAGNOSIS — E063 Autoimmune thyroiditis: Secondary | ICD-10-CM

## 2024-03-09 DIAGNOSIS — Q9989 Other specified chromosome abnormalities: Secondary | ICD-10-CM

## 2024-03-09 DIAGNOSIS — R635 Abnormal weight gain: Secondary | ICD-10-CM

## 2024-03-09 DIAGNOSIS — E8881 Metabolic syndrome: Secondary | ICD-10-CM

## 2024-03-09 DIAGNOSIS — I2510 Atherosclerotic heart disease of native coronary artery without angina pectoris: Secondary | ICD-10-CM

## 2024-03-09 DIAGNOSIS — U099 Post covid-19 condition, unspecified: Secondary | ICD-10-CM

## 2024-03-09 DIAGNOSIS — E279 Disorder of adrenal gland, unspecified: Secondary | ICD-10-CM

## 2024-03-09 DIAGNOSIS — E782 Mixed hyperlipidemia: Secondary | ICD-10-CM

## 2024-03-09 DIAGNOSIS — E039 Hypothyroidism, unspecified: Secondary | ICD-10-CM

## 2024-03-09 DIAGNOSIS — E790 Hyperuricemia without signs of inflammatory arthritis and tophaceous disease: Secondary | ICD-10-CM

## 2024-03-09 DIAGNOSIS — I7090 Unspecified atherosclerosis: Secondary | ICD-10-CM

## 2024-03-09 DIAGNOSIS — Z79890 Hormone replacement therapy (postmenopausal): Secondary | ICD-10-CM

## 2024-03-09 DIAGNOSIS — E559 Vitamin D deficiency, unspecified: Secondary | ICD-10-CM

## 2024-03-09 DIAGNOSIS — E7212 Methylenetetrahydrofolate reductase deficiency: Secondary | ICD-10-CM

## 2024-03-09 DIAGNOSIS — R7982 Elevated C-reactive protein (CRP): Secondary | ICD-10-CM

## 2024-03-09 DIAGNOSIS — N951 Menopausal and female climacteric states: Secondary | ICD-10-CM

## 2024-03-14 ENCOUNTER — Other Ambulatory Visit: Payer: Self-pay | Admitting: Obstetrics and Gynecology

## 2024-03-14 DIAGNOSIS — N6310 Unspecified lump in the right breast, unspecified quadrant: Secondary | ICD-10-CM

## 2024-03-21 ENCOUNTER — Ambulatory Visit
Admission: RE | Admit: 2024-03-21 | Discharge: 2024-03-21 | Disposition: A | Source: Ambulatory Visit | Attending: Obstetrics and Gynecology | Admitting: Obstetrics and Gynecology

## 2024-03-21 DIAGNOSIS — N6310 Unspecified lump in the right breast, unspecified quadrant: Secondary | ICD-10-CM
